# Patient Record
Sex: Male | Born: 1940 | Race: Black or African American | Hispanic: No | Smoking: Current every day smoker
Health system: Southern US, Community
[De-identification: ages and names within clinical notes are randomized; demographics above are authoritative.]

## PROBLEM LIST (undated history)

## (undated) DIAGNOSIS — G629 Polyneuropathy, unspecified: Secondary | ICD-10-CM

## (undated) DIAGNOSIS — I2699 Other pulmonary embolism without acute cor pulmonale: Secondary | ICD-10-CM

## (undated) DIAGNOSIS — E119 Type 2 diabetes mellitus without complications: Secondary | ICD-10-CM

## (undated) DIAGNOSIS — H9313 Tinnitus, bilateral: Secondary | ICD-10-CM

## (undated) DIAGNOSIS — I639 Cerebral infarction, unspecified: Secondary | ICD-10-CM

## (undated) DIAGNOSIS — I10 Essential (primary) hypertension: Secondary | ICD-10-CM

## (undated) DIAGNOSIS — N200 Calculus of kidney: Secondary | ICD-10-CM

## (undated) DIAGNOSIS — E785 Hyperlipidemia, unspecified: Secondary | ICD-10-CM

## (undated) DIAGNOSIS — R6882 Decreased libido: Secondary | ICD-10-CM

## (undated) DIAGNOSIS — N186 End stage renal disease: Secondary | ICD-10-CM

## (undated) DIAGNOSIS — N179 Acute kidney failure, unspecified: Secondary | ICD-10-CM

## (undated) DIAGNOSIS — H269 Unspecified cataract: Secondary | ICD-10-CM

## (undated) DIAGNOSIS — M1990 Unspecified osteoarthritis, unspecified site: Secondary | ICD-10-CM

## (undated) DIAGNOSIS — H9190 Unspecified hearing loss, unspecified ear: Secondary | ICD-10-CM

## (undated) DIAGNOSIS — C801 Malignant (primary) neoplasm, unspecified: Secondary | ICD-10-CM

## (undated) HISTORY — PX: LUMBAR LAMINECTOMY: SHX95

## (undated) HISTORY — DX: Decreased libido: R68.82

## (undated) HISTORY — DX: Unspecified cataract: H26.9

## (undated) HISTORY — DX: Other pulmonary embolism without acute cor pulmonale: I26.99

## (undated) HISTORY — DX: Tinnitus, bilateral: H93.13

## (undated) HISTORY — DX: Calculus of kidney: N20.0

## (undated) HISTORY — DX: End stage renal disease: N18.6

## (undated) HISTORY — DX: Unspecified hearing loss, unspecified ear: H91.90

## (undated) HISTORY — PX: CHOLECYSTECTOMY: SHX55

## (undated) HISTORY — DX: Acute kidney failure, unspecified: N17.9

---

## 1987-08-24 DIAGNOSIS — I639 Cerebral infarction, unspecified: Secondary | ICD-10-CM

## 1987-08-24 HISTORY — DX: Cerebral infarction, unspecified: I63.9

## 2000-08-02 ENCOUNTER — Encounter: Payer: Self-pay | Admitting: Neurosurgery

## 2000-08-02 ENCOUNTER — Encounter: Admission: RE | Admit: 2000-08-02 | Discharge: 2000-08-02 | Payer: Self-pay | Admitting: Neurosurgery

## 2009-01-23 ENCOUNTER — Encounter: Admission: RE | Admit: 2009-01-23 | Discharge: 2009-01-23 | Payer: Self-pay | Admitting: Orthopedic Surgery

## 2009-03-26 ENCOUNTER — Inpatient Hospital Stay (HOSPITAL_COMMUNITY): Admission: RE | Admit: 2009-03-26 | Discharge: 2009-03-28 | Payer: Self-pay | Admitting: Orthopedic Surgery

## 2010-09-14 ENCOUNTER — Encounter: Payer: Self-pay | Admitting: Orthopedic Surgery

## 2010-11-28 LAB — DIFFERENTIAL
Basophils Absolute: 0 10*3/uL (ref 0.0–0.1)
Basophils Relative: 0 % (ref 0–1)
Eosinophils Absolute: 0.2 10*3/uL (ref 0.0–0.7)
Eosinophils Relative: 2 % (ref 0–5)
Monocytes Absolute: 0.9 10*3/uL (ref 0.1–1.0)

## 2010-11-28 LAB — ABO/RH: ABO/RH(D): A POS

## 2010-11-28 LAB — GLUCOSE, CAPILLARY
Glucose-Capillary: 128 mg/dL — ABNORMAL HIGH (ref 70–99)
Glucose-Capillary: 137 mg/dL — ABNORMAL HIGH (ref 70–99)
Glucose-Capillary: 156 mg/dL — ABNORMAL HIGH (ref 70–99)
Glucose-Capillary: 166 mg/dL — ABNORMAL HIGH (ref 70–99)
Glucose-Capillary: 195 mg/dL — ABNORMAL HIGH (ref 70–99)
Glucose-Capillary: 80 mg/dL (ref 70–99)

## 2010-11-28 LAB — URINALYSIS, ROUTINE W REFLEX MICROSCOPIC
Specific Gravity, Urine: 1.005 (ref 1.005–1.030)
Urobilinogen, UA: 1 mg/dL (ref 0.0–1.0)
pH: 6 (ref 5.0–8.0)

## 2010-11-28 LAB — HEMOGLOBIN AND HEMATOCRIT, BLOOD: Hemoglobin: 13.1 g/dL (ref 13.0–17.0)

## 2010-11-28 LAB — CBC
HCT: 45.9 % (ref 39.0–52.0)
Platelets: 127 10*3/uL — ABNORMAL LOW (ref 150–400)
WBC: 12.2 10*3/uL — ABNORMAL HIGH (ref 4.0–10.5)

## 2010-11-28 LAB — COMPREHENSIVE METABOLIC PANEL
ALT: 20 U/L (ref 0–53)
AST: 19 U/L (ref 0–37)
Albumin: 3.8 g/dL (ref 3.5–5.2)
Alkaline Phosphatase: 48 U/L (ref 39–117)
BUN: 10 mg/dL (ref 6–23)
Chloride: 102 mEq/L (ref 96–112)
GFR calc Af Amer: >60 mL/min (ref >60)
Potassium: 3.2 mEq/L — ABNORMAL LOW (ref 3.5–5.1)
Total Bilirubin: 0.9 mg/dL (ref 0.3–1.2)

## 2011-01-05 NOTE — H&P (Signed)
NAME:  [name] [name], [name] [name] NO.:  [id number]   MEDICAL RECORD NO.:  [id number]          PATIENT TYPE:  INP   LOCATION:                               FACILITY:  [facility]   PHYSICIAN:  Georges Lynch. Gioffre, M.D.DATE OF BIRTH:  [birth year]   DATE OF ADMISSION:  03/26/2009  DATE OF DISCHARGE:                              HISTORY & PHYSICAL   The patient to be admitted to [facility] March 26, 2009.   CHIEF COMPLAINT:  Pain in lower back that radiates down to the left leg.   HISTORY OF PRESENT ILLNESS:  Mr. [name] [name] is a 70 year old male who has  been followed by Dr. Tinnie Gens for worsening low back pain.  The patient  had a myelogram that revealed classic spinal stenosis at L3-L4, L4-L5.  The patient plans to have central decompressive lumbar laminectomy at L3-  L4 and L4-L5 without fusion.  The patient states that the pain does  radiate down his left leg, and that he does have some weakness in his  left leg.   ALLERGIES:  The patient is allergic to LISINOPRIL.  The patient denies  food, latex or metal allergies.   PRIMARY CARE PHYSICIAN:  Dr. Linna Darner.   CURRENT MEDICATIONS:  The patient does not know the dosages of the  following medications.  I reminded him that he will need to bring the  dosages to the hospital.  Medication list:   1. Avalide.  2. Lantus.  3. Humalog.  4. Plavix.  5. Lipitor.  6. Flomax.  7. Aspirin.   PAST MEDICAL HISTORY:  1. Stroke in 1989.  2. Cataracts.  3. Dentures.  4. Hypertension.  5. Hyperlipidemia.  6. Hemorrhoids.  7. Diabetes that is insulin-controlled.  8. Bladder cancer.  9. Arthritis.   PAST SURGICAL HISTORY:  1. Cholecystectomy.  2. Surgery for bladder cancer.   FAMILY HISTORY:  Father passed with lung cancer at age 14.  Mother is  diabetic and passed at 47 years of age.   SOCIAL HISTORY:  Patient is widowed.  He is retired.  Patient denies use  of alcohol or drugs.  Patient is a nonsmoker.  Patient lives alone.  Patient  states that he does have a caregiver for after surgery, and  plans to go home after surgery.  He does have a 2-level house, but he is  able to have a bed on the first level that will require him only to go  up 1 stair to get into the house.   REVIEW OF SYSTEMS:  GENERAL:  Negative for fevers, chills or weight  loss.  HEENT/NEUROLOGICAL:  Negative for headache.  Negative for double  vision.  Negative for hearing loss.  Negative for weakness.  Positive  for dentures.  RESPIRATORY:  Negative for shortness of breath.  Negative  for cough.  Negative for wheezing.  CARDIOVASCULAR:  Negative for chest  pain.  Negative for palpitations.  Patient's last EKG was March 05, 2009,  and patient states there were no changes on this EKG.  GI:  Negative for  nausea.  Negative vomiting.  Negative for  blood in the stool.  GENITOURINARY:  Negative for pain with urination.  Positive for  urinating at nighttime.  Negative for blood in the urine, and negative  for frequent UTI.  MUSCULOSKELETAL:  Positive for back pain, back  spasms, and morning stiffness.   PHYSICAL EXAMINATION:  VITAL SIGNS:  Pulse 76, respirations 18, blood  pressure 123/80.  GENERAL:  The patient is alert and oriented x3.  The patient is a 70-  year-old male who is a good historian.  HEENT:  Unremarkable.  NECK:  Supple with full range of motion.  Negative for lymphadenopathy.  HEART:  Regular rate and rhythm.  Distant heart sounds without murmur.  ABDOMEN:  Soft, nontender, positive bowel sounds.  No guarding with  palpation.  EXTREMITIES:  Positive for weakness in the left hip flexors.  Sensation  is intact bilaterally.  The patient does have a positive straight leg  raise in the sitting position with the left leg.  NEUROLOGICAL:  The patient is intact.  SKIN:  Unremarkable.  PERIPHERAL VASCULAR:  Carotid arteries are 2+ bilaterally without  carotid bruit.  Dorsalis pedis pulses aer 2+ bilaterally.   IMPRESSION:  Back pain with  weakness and pain that radiates down the  patient's left leg.   PLAN:  Central decompressive lumbar laminectomy at L3-L4, L4-L5 to be  performed by Dr. Tinnie Gens on March 26, 2009.  The patient has been seen  and cleared for surgery by his primary care physician, Dr. Linna Darner.      Rozell Searing, PAC    ______________________________  Georges Lynch Darrelyn Hillock, M.D.    LD/MEDQ  D:  03/25/2009  T:  03/25/2009  Job:  [id number]

## 2011-01-05 NOTE — Op Note (Signed)
NAME:  [name] [name], [name] [name] NO.:  [id number]   MEDICAL RECORD NO.:  [id number]          PATIENT TYPE:  INP   LOCATION:  0003                         FACILITY:  [facility]   PHYSICIAN:  Georges Lynch. Gioffre, M.D.DATE OF BIRTH:  [birth year]   DATE OF PROCEDURE:  DATE OF DISCHARGE:                               OPERATIVE REPORT   PREOP DIAGNOSES:  1. Severe spinal stenosis at L3-4.  2. Severe spinal stenosis at L4-5.   POSTOP DIAGNOSES:  1. Severe spinal stenosis at L3-4.  2. Severe spinal stenosis at L4-5.   OPERATION:  1. Complete decompressive lumbar laminectomy at L3-4.  2. Complete lumbar laminectomy, decompressive type, at L4-5 both for      spinal stenosis with accompanying foraminotomies bilaterally at      both levels.   PROCEDURE:  Under general anesthesia, routine orthopedic prep and  draping of the back was carried out.  He had 2 grams of IV Ancef.  At  this time two needles were placed in the back for localization purposes  and an x-ray was taken.  Following that we made an incision over the L2,  L3-4, L4-5 interspace regions and the incision was extended proximally  and distally. At this time we stripped the muscle from the lamina and  spinous processes bilaterally.  Following that two Kocher clamps were  placed on the spinous processes to further identify our space.  We then  went down and started our decompression. We removed the spinous  processes of L3 and L4, a portion of L2 as well.  At that time we took  another x-ray with instruments in place to verify our exact position.  We then went down and brought the microscope in and completed our  decompressive lumbar laminectomies at L3-4 and at L4-5.  He was  extremely tight at L3-4 with a marked overgrowth of ligamentum flavum as  well as bone.  We identified the dura and gently protected the dura with  the pledgets and we made sure that the dura was free from both lateral  margins and we went out laterally  and decompressed both recesses.  We  had a very nice decompression at this point. We then advanced distally  at L4-5.  We went distally until we had good open space utilizing the  hockey stick between the dura and the bone.  We did foraminotomies as  well.  We then went up proximally and continued proximally until we had  a wide open decompression. The dura now was totally freed both  proximally and distally and laterally as well.  We did foraminotomies.  We utilized a hockey stick to go out the foramina to make sure there  were no other obstructions.  Note, the ligamentum flavum was severely  thickened in the various areas.  Following that we thoroughly irrigated  out the area.  I injected 10 mL of FloSeal into the area.  I applied  some thrombin-soaked Gelfoam out in the lateral margins and kept it away  from the dura and also put some between the muscle and when we were  ready to close the wound.  We then closed the wound in layers with #1  Vicryl suture.  At this time I left a small deep distal and proximal  part of the wound open for drainage purposes to make sure that no  hematomas would compress the dura.  I then closed subcu with 0 Vicryl  and skin with metal staples.  A sterile Neosporin dressing was  applied.  Note, he had 2 grams of IV Ancef preop and he also had a Foley  catheter in place.  We did go through the routine time-out procedure  prior to the surgery.   ASSISTANT:  Dr. Fayrene Fearing [place].           ______________________________  Georges Lynch. Darrelyn Hillock, M.D.     RAG/MEDQ  D:  03/26/2009  T:  03/26/2009  Job:  [id number]

## 2011-07-13 HISTORY — PX: FEMORAL BYPASS: SHX50

## 2011-07-22 HISTORY — PX: AMPUTATION: SHX166

## 2012-07-26 ENCOUNTER — Encounter (HOSPITAL_COMMUNITY): Payer: Self-pay

## 2012-07-27 ENCOUNTER — Encounter (HOSPITAL_COMMUNITY): Payer: Self-pay | Admitting: Pharmacy Technician

## 2012-07-27 ENCOUNTER — Encounter (HOSPITAL_COMMUNITY)
Admission: RE | Admit: 2012-07-27 | Discharge: 2012-07-27 | Disposition: A | Discharge status: Home / Self Care | Payer: Medicare Other | Source: Ambulatory Visit | Attending: Urology | Admitting: Urology

## 2012-07-27 ENCOUNTER — Encounter (HOSPITAL_COMMUNITY): Payer: Self-pay

## 2012-07-27 HISTORY — DX: Cerebral infarction, unspecified: I63.9

## 2012-07-27 HISTORY — DX: Essential (primary) hypertension: I10

## 2012-07-27 HISTORY — DX: Unspecified osteoarthritis, unspecified site: M19.90

## 2012-07-27 HISTORY — DX: Malignant (primary) neoplasm, unspecified: C80.1

## 2012-07-27 HISTORY — DX: Hyperlipidemia, unspecified: E78.5

## 2012-07-27 HISTORY — DX: Type 2 diabetes mellitus without complications: E11.9

## 2012-07-27 HISTORY — DX: Polyneuropathy, unspecified: G62.9

## 2012-07-27 LAB — CBC
Hemoglobin: 14.5 g/dL (ref 13.0–17.0)
MCH: 30.7 pg (ref 26.0–34.0)
MCV: 90.3 fL (ref 78.0–100.0)
Platelets: 199 10*3/uL (ref 150–400)
RBC: 4.72 MIL/uL (ref 4.22–5.81)
WBC: 11 10*3/uL — ABNORMAL HIGH (ref 4.0–10.5)

## 2012-07-27 LAB — BASIC METABOLIC PANEL
CO2: 27 mEq/L (ref 19–32)
Calcium: 9.6 mg/dL (ref 8.4–10.5)
Chloride: 105 mEq/L (ref 96–112)
Creatinine, Ser: 1.08 mg/dL (ref 0.50–1.35)
Glucose, Bld: 68 mg/dL — ABNORMAL LOW (ref 70–99)
Sodium: 142 mEq/L (ref 135–145)

## 2012-07-27 NOTE — Patient Instructions (Addendum)
20 [name] [name]  07/27/2012   Your procedure is scheduled on:  08/03/12  Report to [facility] at 07:30 AM.  Call this number if you have problems the morning of surgery: [phone]   Remember:   Do not eat food:After Midnight.  May have clear liquids:until Midnight .  Clear liquids include soda, tea, black coffee, apple or grape juice, broth.  Take these medicines the morning of surgery with A SIP OF WATER: Amlodipine and Avalide.  No insulin morning of surgery.    Do not wear jewelry, make-up or nail polish.  Do not wear lotions, powders, or perfumes.   Do not shave 48 hours prior to surgery. Men may shave face and neck.  Do not bring valuables to the hospital.  Contacts, dentures or bridgework may not be worn into surgery.  Leave suitcase in the car. After surgery it may be brought to your room.  For patients admitted to the hospital, checkout time is 11:00 AM the day of discharge.   Patients discharged the day of surgery will not be allowed to drive home.    Special Instructions: Shower using CHG 2 nights before surgery and the night before surgery.  If you shower the day of surgery use CHG.  Use special wash - you have one bottle of CHG for all showers.  You should use approximately 1/3 of the bottle for each shower.   Please read over the following fact sheets that you were given: Pain Booklet, Surgical Site Infection Prevention, Anesthesia Post-op Instructions and Care and Recovery After Surgery    Cystoscopy (Bladder Exam) A cystoscopy is an examination of your urinary bladder with a cystoscope. A cystoscope is an instrument like a small telescope with strong lights and lenses. It is inserted into the bladder through the urethra (the opening into the bladder) and allows your caregiver to examine the inside of your bladder. The procedure causes little discomfort and can be done in a hospital or office. It is a diagnostic procedure to evaluate the inside of your bladder. It may  involve x-rays to further evaluate the ureters or internal aspects of the kidneys. It may aid in the removal of urinary stones  or in taking tissue samples (biopsies) if necessary. The procedure is easier in females because of a shorter urethra. In a male, the procedure must be done through the penis. This often requires more sedation and more time to do the procedure. The procedure usually takes twenty minutes to one half hour for a male and approximately an hour for a male. LET YOUR CAREGIVERS KNOW ABOUT:  Allergies.  Medications taken including herbs, eye drops, over the counter medications, and creams.  Use of steroids (by mouth or creams).  Previous problems with anesthetics or novocaine.  Possibility of pregnancy, if this applies.  History of blood clots (thrombophlebitis).  History of bleeding or blood problems.  Previous surgery, especially where prosthetics have been used like hip or knee replacements, and heart valve replacements.  Other health problems. BEFORE THE PROCEDURE  You should be present 60 minutes prior to your procedure or as directed.  PROCEDURE During the procedure, you will:  Be assisted by your urologist and a nurse.  Lie on a cystoscopy table with your knees elevated and legs apart and covered with a drape. For women this is the same position as when a pap smear is taken.  Have the urethral area or penis washed and covered with sterile towels.  Have an anesthetic (numbing)  jelly applied to the urethra. This is usually all that is required for females but males may also require sedation.  Have the cystoscope inserted through the urethra and into the bladder. Sterile fluid will flow through the cystoscope and into the bladder. This will expand the bladder and provide clear fluid for the urologist to look through and examine the interior of the bladder.  Be allowed to go home once you are doing well, are stable, and awake if you were given a sedative. If  given a sedative, have someone give you a ride home. AFTER THE PROCEDURE  You may have temporary bleeding and burning on urination.  Drink lots of fluids. SEEK IMMEDIATE MEDICAL CARE IF:  There is an increase in blood in the urine or if you are passing clots.  You have difficulty in passing your urine  You develop chills and/or an unexplained oral temperature above 102 F (38.9 C). Your caregiver will discuss your results with you following the procedure. This may be at a later time if you have been sedated. If other testing or biopsies were taken, ask your caregiver how you are to obtain the results. Remember it is your responsibility to get your results. Do not assume everything is normal if you do not hear from your caregiver. Document Released: 08/06/2000 Document Revised: 11/01/2011 Document Reviewed: 01/31/2012 [facility] Patient Information 2013 [place], [place].    PATIENT INSTRUCTIONS POST-ANESTHESIA  IMMEDIATELY FOLLOWING SURGERY:  Do not drive or operate machinery for the first twenty four hours after surgery.  Do not make any important decisions for twenty four hours after surgery or while taking narcotic pain medications or sedatives.  If you develop intractable nausea and vomiting or a severe headache please notify your doctor immediately.  FOLLOW-UP:  Please make an appointment with your surgeon as instructed. You do not need to follow up with anesthesia unless specifically instructed to do so.  WOUND CARE INSTRUCTIONS (if applicable):  Keep a dry clean dressing on the anesthesia/puncture wound site if there is drainage.  Once the wound has quit draining you may leave it open to air.  Generally you should leave the bandage intact for twenty four hours unless there is drainage.  If the epidural site drains for more than 36-48 hours please call the anesthesia department.  QUESTIONS?:  Please feel free to call your physician or the hospital operator if you have any questions,  and they will be happy to assist you.

## 2012-08-03 ENCOUNTER — Ambulatory Visit (HOSPITAL_COMMUNITY): Payer: Medicare Other | Admitting: Anesthesiology

## 2012-08-03 ENCOUNTER — Ambulatory Visit (HOSPITAL_COMMUNITY)
Admission: RE | Admit: 2012-08-03 | Discharge: 2012-08-03 | Disposition: A | Discharge status: Home / Self Care | Payer: Medicare Other | Source: Ambulatory Visit | Attending: Urology | Admitting: Urology

## 2012-08-03 ENCOUNTER — Encounter (HOSPITAL_COMMUNITY)
Admission: RE | Disposition: A | Discharge status: Home / Self Care | Payer: Self-pay | Source: Ambulatory Visit | Attending: Urology

## 2012-08-03 ENCOUNTER — Ambulatory Visit (HOSPITAL_COMMUNITY): Payer: Medicare Other

## 2012-08-03 ENCOUNTER — Encounter (HOSPITAL_COMMUNITY): Payer: Self-pay | Admitting: Anesthesiology

## 2012-08-03 ENCOUNTER — Encounter (HOSPITAL_COMMUNITY): Payer: Self-pay | Admitting: *Deleted

## 2012-08-03 DIAGNOSIS — Z01810 Encounter for preprocedural cardiovascular examination: Secondary | ICD-10-CM | POA: Insufficient documentation

## 2012-08-03 DIAGNOSIS — D494 Neoplasm of unspecified behavior of bladder: Secondary | ICD-10-CM

## 2012-08-03 DIAGNOSIS — I10 Essential (primary) hypertension: Secondary | ICD-10-CM | POA: Insufficient documentation

## 2012-08-03 DIAGNOSIS — R82998 Other abnormal findings in urine: Secondary | ICD-10-CM | POA: Insufficient documentation

## 2012-08-03 DIAGNOSIS — Z01812 Encounter for preprocedural laboratory examination: Secondary | ICD-10-CM | POA: Insufficient documentation

## 2012-08-03 DIAGNOSIS — Z8551 Personal history of malignant neoplasm of bladder: Secondary | ICD-10-CM | POA: Insufficient documentation

## 2012-08-03 DIAGNOSIS — Z86018 Personal history of other benign neoplasm: Secondary | ICD-10-CM

## 2012-08-03 DIAGNOSIS — E119 Type 2 diabetes mellitus without complications: Secondary | ICD-10-CM | POA: Insufficient documentation

## 2012-08-03 HISTORY — PX: URETEROSCOPY: SHX842

## 2012-08-03 HISTORY — PX: BIOPSY: SHX5522

## 2012-08-03 HISTORY — PX: CYSTOSCOPY W/ RETROGRADES: SHX1426

## 2012-08-03 LAB — GLUCOSE, CAPILLARY: Glucose-Capillary: 100 mg/dL — ABNORMAL HIGH (ref 70–99)

## 2012-08-03 SURGERY — URETEROSCOPY
Anesthesia: General | Site: Urethra | Laterality: Right | Wound class: Clean Contaminated

## 2012-08-03 MED ORDER — PROMETHAZINE HCL 25 MG/ML IJ SOLN
INTRAMUSCULAR | Status: AC
  Filled 2012-08-03: qty 1

## 2012-08-03 MED ORDER — DEXAMETHASONE SODIUM PHOSPHATE 4 MG/ML IJ SOLN
4.0000 mg | Freq: Once | INTRAMUSCULAR | Status: AC
  Administered 2012-08-03: 4 mg via INTRAVENOUS

## 2012-08-03 MED ORDER — IOHEXOL 350 MG/ML SOLN
INTRAVENOUS | Status: DC | PRN
  Administered 2012-08-03 (×2): 50 mL via INTRAVENOUS

## 2012-08-03 MED ORDER — ONDANSETRON HCL 4 MG/2ML IJ SOLN
INTRAMUSCULAR | Status: AC
  Filled 2012-08-03: qty 2

## 2012-08-03 MED ORDER — STERILE WATER FOR IRRIGATION IR SOLN
Status: DC | PRN
  Administered 2012-08-03: 1000 mL

## 2012-08-03 MED ORDER — MIDAZOLAM HCL 2 MG/2ML IJ SOLN
1.0000 mg | INTRAMUSCULAR | Status: DC | PRN
  Administered 2012-08-03 (×2): 2 mg via INTRAVENOUS

## 2012-08-03 MED ORDER — FENTANYL CITRATE 0.05 MG/ML IJ SOLN
INTRAMUSCULAR | Status: AC
  Filled 2012-08-03: qty 2

## 2012-08-03 MED ORDER — ONDANSETRON HCL 4 MG/2ML IJ SOLN
INTRAMUSCULAR | Status: DC | PRN
  Administered 2012-08-03: 4 mg via INTRAVENOUS

## 2012-08-03 MED ORDER — FENTANYL CITRATE 0.05 MG/ML IJ SOLN
25.0000 ug | INTRAMUSCULAR | Status: DC | PRN
  Administered 2012-08-03: 25 ug via INTRAVENOUS

## 2012-08-03 MED ORDER — DROPERIDOL 2.5 MG/ML IJ SOLN
INTRAMUSCULAR | Status: DC | PRN
  Administered 2012-08-03: 0.625 mg via INTRAVENOUS

## 2012-08-03 MED ORDER — PROPOFOL 10 MG/ML IV BOLUS
INTRAVENOUS | Status: DC | PRN
  Administered 2012-08-03: 10 mg via INTRAVENOUS
  Administered 2012-08-03 (×2): 20 mg via INTRAVENOUS
  Administered 2012-08-03: 150 mg via INTRAVENOUS

## 2012-08-03 MED ORDER — EPHEDRINE SULFATE 50 MG/ML IJ SOLN
INTRAMUSCULAR | Status: DC | PRN
  Administered 2012-08-03 (×4): 5 mg via INTRAVENOUS

## 2012-08-03 MED ORDER — FENTANYL CITRATE 0.05 MG/ML IJ SOLN
INTRAMUSCULAR | Status: DC | PRN
  Administered 2012-08-03 (×5): 25 ug via INTRAVENOUS
  Administered 2012-08-03: 12.5 ug via INTRAVENOUS
  Administered 2012-08-03 (×2): 25 ug via INTRAVENOUS
  Administered 2012-08-03: 12.5 ug via INTRAVENOUS

## 2012-08-03 MED ORDER — PROMETHAZINE HCL 25 MG/ML IJ SOLN
6.2500 mg | Freq: Once | INTRAMUSCULAR | Status: AC
  Administered 2012-08-03: 6.25 mg via INTRAVENOUS

## 2012-08-03 MED ORDER — ONDANSETRON HCL 4 MG/2ML IJ SOLN: 4.0000 mg | Freq: Once | INTRAMUSCULAR | Status: DC | PRN

## 2012-08-03 MED ORDER — MIDAZOLAM HCL 2 MG/2ML IJ SOLN
INTRAMUSCULAR | Status: AC
  Filled 2012-08-03: qty 2

## 2012-08-03 MED ORDER — PROPOFOL 10 MG/ML IV EMUL
INTRAVENOUS | Status: AC
  Filled 2012-08-03: qty 20

## 2012-08-03 MED ORDER — LACTATED RINGERS IV SOLN
INTRAVENOUS | Status: DC
  Administered 2012-08-03 (×2): via INTRAVENOUS

## 2012-08-03 MED ORDER — SODIUM CHLORIDE 0.9 % IR SOLN
Status: DC | PRN
  Administered 2012-08-03 (×6): 3000 mL

## 2012-08-03 MED ORDER — OXYCODONE-ACETAMINOPHEN 7.5-325 MG PO TABS: 1.0000 | ORAL_TABLET | Freq: Four times a day (QID) | ORAL | Status: DC | PRN

## 2012-08-03 MED ORDER — LIDOCAINE HCL (PF) 1 % IJ SOLN
INTRAMUSCULAR | Status: AC
  Filled 2012-08-03: qty 5

## 2012-08-03 MED ORDER — DEXAMETHASONE SODIUM PHOSPHATE 4 MG/ML IJ SOLN
INTRAMUSCULAR | Status: AC
  Filled 2012-08-03: qty 1

## 2012-08-03 MED ORDER — 0.9 % SODIUM CHLORIDE (POUR BTL) OPTIME
TOPICAL | Status: DC | PRN
  Administered 2012-08-03: 1000 mL

## 2012-08-03 MED ORDER — EPHEDRINE SULFATE 50 MG/ML IJ SOLN
INTRAMUSCULAR | Status: AC
  Filled 2012-08-03: qty 1

## 2012-08-03 MED ORDER — LIDOCAINE HCL (CARDIAC) 10 MG/ML IV SOLN
INTRAVENOUS | Status: DC | PRN
  Administered 2012-08-03: 20 mg via INTRAVENOUS

## 2012-08-03 MED ORDER — ONDANSETRON HCL 4 MG/2ML IJ SOLN
4.0000 mg | Freq: Once | INTRAMUSCULAR | Status: AC
  Administered 2012-08-03: 4 mg via INTRAVENOUS

## 2012-08-03 SURGICAL SUPPLY — 32 items
BAG DRAIN URO TABLE W/ADPT NS (DRAPE) ×5 IMPLANT
CATH 5 FR WEDGE TIP (UROLOGICAL SUPPLIES) ×5 IMPLANT
CATH OPEN TIP 5FR (CATHETERS) ×10 IMPLANT
CLOTH BEACON ORANGE TIMEOUT ST (SAFETY) ×5 IMPLANT
CONT SPECI 4OZ STER CLIK (MISCELLANEOUS) ×10 IMPLANT
DECANTER SPIKE VIAL GLASS SM (MISCELLANEOUS) ×10 IMPLANT
DILATOR BALLN URETERAL SET (BALLOONS) IMPLANT
DILATOR UROMAX ULTRA (MISCELLANEOUS) ×5 IMPLANT
FORCEPS BIOP PIRANHA Y (CUTTING FORCEPS) ×5 IMPLANT
GLOVE BIO SURGEON STRL SZ7 (GLOVE) ×5 IMPLANT
GLOVE BIOGEL PI IND STRL 7.0 (GLOVE) ×8 IMPLANT
GLOVE BIOGEL PI INDICATOR 7.0 (GLOVE) ×2
GLOVE ECLIPSE 6.5 STRL STRAW (GLOVE) ×5 IMPLANT
GLOVE ECLIPSE 7.0 STRL STRAW (GLOVE) ×5 IMPLANT
GOWN STRL REIN XL XLG (GOWN DISPOSABLE) ×5 IMPLANT
GUIDEWIRE ANG ZIPWIRE 038X150 (WIRE) ×5 IMPLANT
GUIDEWIRE STR DUAL SENSOR (WIRE) ×5 IMPLANT
IV NS IRRIG 3000ML ARTHROMATIC (IV SOLUTION) ×30 IMPLANT
KIT ROOM TURNOVER AP CYSTO (KITS) ×5 IMPLANT
MANIFOLD NEPTUNE II (INSTRUMENTS) ×5 IMPLANT
NEEDLE HYPO 25X1 1.5 SAFETY (NEEDLE) ×5 IMPLANT
NS IRRIG 1000ML POUR BTL (IV SOLUTION) ×5 IMPLANT
PACK CYSTO (CUSTOM PROCEDURE TRAY) ×5 IMPLANT
PAD ARMBOARD 7.5X6 YLW CONV (MISCELLANEOUS) ×5 IMPLANT
PAD TELFA 3X4 1S STER (GAUZE/BANDAGES/DRESSINGS) ×5 IMPLANT
STENT PERCUFLEX 4.8FRX24 (STENTS) ×5 IMPLANT
STONE RETRIEVAL GEMINI 2.4 FR (MISCELLANEOUS) ×5 IMPLANT
SYR CONTROL 10ML LL (SYRINGE) ×5 IMPLANT
TOWEL OR 17X26 4PK STRL BLUE (TOWEL DISPOSABLE) ×5 IMPLANT
WATER STERILE IRR 1000ML POUR (IV SOLUTION) ×5 IMPLANT
WIRE GUIDE BENTSON .035 15CM (WIRE) ×10 IMPLANT
ZIPWIRE GUIDEWIRE STANDARD ×5 IMPLANT

## 2012-08-03 NOTE — Brief Op Note (Signed)
08/03/2012  11:54 AM  PATIENT:  [name]  71 y.o. male  PRE-OPERATIVE DIAGNOSIS:  bladder cancer  POST-OPERATIVE DIAGNOSIS:  * No post-op diagnosis entered *  PROCEDURE:  Procedure(s) (LRB) with comments: URETEROSCOPY (Right) CYSTOSCOPY WITH RETROGRADE PYELOGRAM (Bilateral) URETERAL CATHETER OR STENT PLACEMENT (Right) URETERAL DILITATION (N/A)  SURGEON:  Surgeon(s) and Role:    * Ky Barban, MD - Primary  PHYSICIAN ASSISTANT:   ASSISTANTS: none   ANESTHESIA:   general  EBL:  Total I/O In: 1750 [I.V.:1750] Out: 0   BLOOD ADMINISTERED:none  DRAINS:    LOCAL MEDICATIONS USED:  NONE  SPECIMEN:  Source of Specimen:  distal r ureter  DISPOSITION OF SPECIMEN:  PATHOLOGY  COUNTS:  YES  TOURNIQUET:  * No tourniquets in log *  DICTATION: .Other Dictation: Dictation Number dictation [id number]  PLAN OF CARE: Discharge to home after PACU  PATIENT DISPOSITION:  PACU - hemodynamically stable.   Delay start of Pharmacological VTE agent (>24hrs) due to surgical blood loss or risk of bleeding:

## 2012-08-03 NOTE — Anesthesia Preprocedure Evaluation (Addendum)
Anesthesia Evaluation  Patient identified by MRN, date of birth, ID band Patient awake    Reviewed: Allergy & Precautions, H&P , NPO status , Patient's Chart, lab work & pertinent test results  Airway Mallampati: II TM Distance: >3 FB     Dental  (+) Poor Dentition, Missing and Chipped   Pulmonary neg pulmonary ROS,  breath sounds clear to auscultation        Cardiovascular hypertension, Pt. on medications Rhythm:Regular Rate:Normal     Neuro/Psych CVA, No Residual Symptoms    GI/Hepatic negative GI ROS,   Endo/Other  diabetes, Well Controlled, Type 2, Insulin Dependent  Renal/GU      Musculoskeletal   Abdominal   Peds  Hematology   Anesthesia Other Findings   Reproductive/Obstetrics                           Anesthesia Physical Anesthesia Plan  ASA: III  Anesthesia Plan: General   Post-op Pain Management:    Induction: Intravenous  Airway Management Planned: LMA  Additional Equipment:   Intra-op Plan:   Post-operative Plan: Extubation in OR  Informed Consent: I have reviewed the patients History and Physical, chart, labs and discussed the procedure including the risks, benefits and alternatives for the proposed anesthesia with the patient or authorized representative who has indicated his/her understanding and acceptance.     Plan Discussed with:   Anesthesia Plan Comments:         Anesthesia Quick Evaluation

## 2012-08-03 NOTE — Anesthesia Procedure Notes (Signed)
Procedure Name: LMA Insertion Date/Time: 08/03/2012 9:15 AM Performed by: Franco Nones Pre-anesthesia Checklist: Patient identified, Patient being monitored, Emergency Drugs available, Timeout performed and Suction available Patient Re-evaluated:Patient Re-evaluated prior to inductionOxygen Delivery Method: Circle System Utilized Preoxygenation: Pre-oxygenation with 100% oxygen Intubation Type: IV induction Ventilation: Mask ventilation without difficulty LMA: LMA inserted LMA Size: 4.0 Number of attempts: 1 Placement Confirmation: positive ETCO2 and breath sounds checked- equal and bilateral Tube secured with: Tape

## 2012-08-03 NOTE — Progress Notes (Signed)
No change in H&P on reexamination. 

## 2012-08-03 NOTE — Transfer of Care (Addendum)
Immediate Anesthesia Transfer of Care Note  Patient: [name] [name]  Procedure(s) Performed: Procedure(s) (LRB): URETEROSCOPY (Right) CYSTOSCOPY WITH RETROGRADE PYELOGRAM (Bilateral) URETERAL CATHETER OR STENT PLACEMENT (Right) URETERAL DILITATION (N/A) BIOPSY (Right)  Patient Location: PACU  Anesthesia Type: General  Level of Consciousness: awake  Airway & Oxygen Therapy: Patient Spontanous Breathing and non-rebreather face mask  Post-op Assessment: Report given to PACU RN, Post -op Vital signs reviewed and stable and Patient moving all extremities  Post vital signs: Reviewed and stable  Complications:  N/V present post extubation

## 2012-08-03 NOTE — Anesthesia Postprocedure Evaluation (Signed)
  Anesthesia Post-op Note  Patient: [name] [name]  Procedure(s) Performed: Procedure(s) (LRB) with comments: URETEROSCOPY (Right) CYSTOSCOPY WITH RETROGRADE PYELOGRAM (Bilateral) URETERAL CATHETER OR STENT PLACEMENT (Right) URETERAL DILITATION (N/A) - balloon dilation BIOPSY (Right) - distal third of right ureter  Patient Location: Short Stay  Anesthesia Type:General  Level of Consciousness: awake, alert , oriented and patient cooperative  Airway and Oxygen Therapy: Patient Spontanous Breathing  Post-op Pain: mild  Post-op Assessment: Post-op Vital signs reviewed, Patient's Cardiovascular Status Stable, Respiratory Function Stable and No signs of Nausea or vomiting  Post-op Vital Signs: Reviewed and stable  Complications: No apparent anesthesia complications

## 2012-08-04 ENCOUNTER — Encounter (HOSPITAL_COMMUNITY): Payer: Self-pay | Admitting: *Deleted

## 2012-08-04 ENCOUNTER — Emergency Department (HOSPITAL_COMMUNITY)
Admission: EM | Admit: 2012-08-04 | Discharge: 2012-08-04 | Disposition: A | Discharge status: Home / Self Care | Payer: Medicare Other | Attending: Emergency Medicine | Admitting: Emergency Medicine

## 2012-08-04 DIAGNOSIS — E785 Hyperlipidemia, unspecified: Secondary | ICD-10-CM | POA: Insufficient documentation

## 2012-08-04 DIAGNOSIS — Z7901 Long term (current) use of anticoagulants: Secondary | ICD-10-CM | POA: Insufficient documentation

## 2012-08-04 DIAGNOSIS — R339 Retention of urine, unspecified: Secondary | ICD-10-CM | POA: Insufficient documentation

## 2012-08-04 DIAGNOSIS — Z79899 Other long term (current) drug therapy: Secondary | ICD-10-CM | POA: Insufficient documentation

## 2012-08-04 DIAGNOSIS — Z8673 Personal history of transient ischemic attack (TIA), and cerebral infarction without residual deficits: Secondary | ICD-10-CM | POA: Insufficient documentation

## 2012-08-04 DIAGNOSIS — Z8551 Personal history of malignant neoplasm of bladder: Secondary | ICD-10-CM | POA: Insufficient documentation

## 2012-08-04 DIAGNOSIS — Z7982 Long term (current) use of aspirin: Secondary | ICD-10-CM | POA: Insufficient documentation

## 2012-08-04 DIAGNOSIS — R300 Dysuria: Secondary | ICD-10-CM | POA: Insufficient documentation

## 2012-08-04 DIAGNOSIS — Z794 Long term (current) use of insulin: Secondary | ICD-10-CM | POA: Insufficient documentation

## 2012-08-04 DIAGNOSIS — R319 Hematuria, unspecified: Secondary | ICD-10-CM | POA: Insufficient documentation

## 2012-08-04 DIAGNOSIS — F17200 Nicotine dependence, unspecified, uncomplicated: Secondary | ICD-10-CM | POA: Insufficient documentation

## 2012-08-04 DIAGNOSIS — I10 Essential (primary) hypertension: Secondary | ICD-10-CM | POA: Insufficient documentation

## 2012-08-04 DIAGNOSIS — Z8739 Personal history of other diseases of the musculoskeletal system and connective tissue: Secondary | ICD-10-CM | POA: Insufficient documentation

## 2012-08-04 DIAGNOSIS — E119 Type 2 diabetes mellitus without complications: Secondary | ICD-10-CM | POA: Insufficient documentation

## 2012-08-04 LAB — URINE MICROSCOPIC-ADD ON

## 2012-08-04 LAB — URINALYSIS, ROUTINE W REFLEX MICROSCOPIC
Glucose, UA: 500 mg/dL — AB
pH: 6 (ref 5.0–8.0)

## 2012-08-04 LAB — BASIC METABOLIC PANEL
BUN: 19 mg/dL (ref 6–23)
CO2: 26 mEq/L (ref 19–32)
Calcium: 9.6 mg/dL (ref 8.4–10.5)
Glucose, Bld: 266 mg/dL — ABNORMAL HIGH (ref 70–99)
Sodium: 141 mEq/L (ref 135–145)

## 2012-08-04 LAB — CBC WITH DIFFERENTIAL/PLATELET
Eosinophils Relative: 0 % (ref 0–5)
HCT: 44 % (ref 39.0–52.0)
Hemoglobin: 15.3 g/dL (ref 13.0–17.0)
Lymphocytes Relative: 6 % — ABNORMAL LOW (ref 12–46)
Lymphs Abs: 1.2 10*3/uL (ref 0.7–4.0)
MCH: 31.7 pg (ref 26.0–34.0)
MCV: 91.3 fL (ref 78.0–100.0)
Monocytes Absolute: 1.2 10*3/uL — ABNORMAL HIGH (ref 0.1–1.0)
Monocytes Relative: 7 % (ref 3–12)
Platelets: 195 10*3/uL (ref 150–400)
RBC: 4.82 MIL/uL (ref 4.22–5.81)
WBC: 19 10*3/uL — ABNORMAL HIGH (ref 4.0–10.5)

## 2012-08-04 LAB — PROTIME-INR: INR: 1.09 (ref 0.00–1.49)

## 2012-08-04 MED ORDER — DEXTROSE 5 % IV SOLN
1.0000 g | Freq: Once | INTRAVENOUS | Status: AC
  Administered 2012-08-04: 1 g via INTRAVENOUS
  Filled 2012-08-04: qty 10

## 2012-08-04 MED ORDER — CEPHALEXIN 500 MG PO CAPS: 500.0000 mg | ORAL_CAPSULE | Freq: Four times a day (QID) | ORAL | Status: DC

## 2012-08-04 MED ORDER — ONDANSETRON 4 MG PO TBDP: 4.0000 mg | ORAL_TABLET | Freq: Three times a day (TID) | ORAL | Status: DC | PRN

## 2012-08-04 MED ORDER — ONDANSETRON HCL 4 MG/2ML IJ SOLN
4.0000 mg | Freq: Once | INTRAMUSCULAR | Status: AC
  Administered 2012-08-04: 4 mg via INTRAVENOUS
  Filled 2012-08-04: qty 2

## 2012-08-04 NOTE — ED Notes (Addendum)
Unable to urinate since 1pm. , Had surgery yesterday, Dr Jerre Simon.   ?stent placement, ureterostomy .  Vomitng since surgery.

## 2012-08-04 NOTE — ED Provider Notes (Addendum)
History   This chart was scribed for Shelda Jakes, MD by Gerlean Ren, ED Scribe. This patient was seen in room [facility]03/[facility]03 and the patient's care was started at 7:14 PM    CSN: [id number]  Arrival date & time 08/04/12  1721   First MD Initiated Contact with Patient 08/04/12 1817      Chief Complaint  Patient presents with  . Urinary Retention     The history is provided by the patient and a relative. No language interpreter was used.   [name] [name] is a 71 y.o. male with h/o HTN, DM, CVA, bladder cancer, and neuropathy who presents to the Emergency Department complaining of urinary retention.  Pt last urinated on his own at 1:00 PM with associated constant, gradually worsening, dull, non-radiating lower abdominal pain and constant nausea since then.  Pt denies emesis, chest pain, dyspnea, cough, HA, congestion, sore throat, fever, back pain.  Pt has had bladder cancer first diagnosed 5.5-6 years ago.  Pt had a stent put in place yesterday here that is to be removed 12/23.  Pt had catheter put in place here in ED with immediate light pink urine.  Pt is a current everyday smoker but denies alcohol use.  Urologist Dr. Renda Rolls PCP is Dr. Janeece Fitting in [place].  Past Medical History  Diagnosis Date  . Hypertension   . Diabetes mellitus without complication   . Hyperlipidemia   . Neuropathy   . Stroke 1989    no deficits  . Arthritis   . Cancer     Bladder    Past Surgical History  Procedure Date  . Cholecystectomy   . Bladder cancer   . Lumbar laminectomy   . 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 Femoral bypass     History reviewed. No pertinent family history.  History  Substance Use Topics  . Smoking status: Current Every Day Smoker -- 0.2 packs/day for 40 years    Types: Cigarettes  . Smokeless tobacco: Not on file  . Alcohol Use: No      Review of Systems  Constitutional: Negative for fever.  HENT: Negative for congestion and  sore throat.   Respiratory: Negative for cough and shortness of breath.   Cardiovascular: Negative for chest pain.  Gastrointestinal: Negative for nausea, vomiting, abdominal pain and diarrhea.  Genitourinary: Positive for difficulty urinating.  Musculoskeletal: Negative for back pain.  Skin: Negative for rash.  Neurological: Negative for headaches.  Hematological: Does not bruise/bleed easily.  Psychiatric/Behavioral: Negative for confusion.    Allergies  Ace inhibitors; Codeine; and Lisinopril  Home Medications   Current Outpatient Rx  Name  Route  Sig  Dispense  Refill  . AMLODIPINE BESYLATE 10 MG PO TABS   Oral   Take 10 mg by mouth daily.         . ASPIRIN EC 81 MG PO TBEC   Oral   Take 81 mg by mouth daily.         . ATORVASTATIN CALCIUM 20 MG PO TABS   Oral   Take 20 mg by mouth daily.         Marland Kitchen CLOPIDOGREL BISULFATE 75 MG PO TABS   Oral   Take 75 mg by mouth daily.         Marland Kitchen GLIPIZIDE 10 MG PO TABS   Oral   Take 20 mg by mouth 2 (two) times daily before a meal.         .  INSULIN GLARGINE 100 UNIT/ML [place] SOLN   Subcutaneous   Inject 46 Units into the skin daily.         . INSULIN LISPRO (HUMAN) 100 UNIT/ML [place] SOLN   Subcutaneous   Inject 40 Units into the skin daily as needed. Sliding scale         . IRBESARTAN-HYDROCHLOROTHIAZIDE 300-12.5 MG PO TABS   Oral   Take 1 tablet by mouth daily.         Marland Kitchen METFORMIN HCL 1000 MG PO TABS   Oral   Take 1,000 mg by mouth 2 (two) times daily with a meal.         . OXYCODONE-ACETAMINOPHEN 7.5-325 MG PO TABS   Oral   Take 1 tablet by mouth every 6 (six) hours as needed for pain.   30 tablet   0   . PREGABALIN 75 MG PO CAPS   Oral   Take 75 mg by mouth 2 (two) times daily.         Marland Kitchen TAMSULOSIN HCL 0.4 MG PO CAPS   Oral   Take 0.4 mg by mouth daily.         . WARFARIN SODIUM 5 MG PO TABS   Oral   Take 10 mg by mouth daily.         . CEPHALEXIN 500 MG PO CAPS   Oral   Take 1  capsule (500 mg total) by mouth 4 (four) times daily.   28 capsule   0   . ONDANSETRON 4 MG PO TBDP   Oral   Take 1 tablet (4 mg total) by mouth every 8 (eight) hours as needed for nausea.   12 tablet   0     BP 213/97  Pulse 94  Temp 97.6 F (36.4 C) (Oral)  Resp 22  Ht 6' 3.5" (1.918 m)  Wt 240 lb (108.863 kg)  BMI 29.60 kg/m2  SpO2 100%  Physical Exam  Nursing note and vitals reviewed. Constitutional: He is oriented to person, place, and time. He appears well-developed and well-nourished.  HENT:  Head: Normocephalic and atraumatic.  Eyes: Conjunctivae normal and EOM are normal.  Neck: Normal range of motion. No tracheal deviation present.  Cardiovascular: Normal rate, regular rhythm and normal heart sounds.   Pulmonary/Chest: Effort normal and breath sounds normal. He has no wheezes.  Abdominal: Soft. Bowel sounds are normal. There is no tenderness.  Genitourinary:       1200cc maroon-colored urine in catheter bag at bedside.  Musculoskeletal: Normal range of motion.       Right foot cap refill <2 seconds.  Blood flow slow in left leg.  Neurological: He is alert and oriented to person, place, and time. No cranial nerve deficit. Coordination normal.  Skin: Skin is warm. No rash noted.  Psychiatric: He has a normal mood and affect.    ED Course  Procedures (including critical care time) DIAGNOSTIC STUDIES: Oxygen Saturation is 100% on room air, normal by my interpretation.    COORDINATION OF CARE: 7:25 PM- Patient and son informed of clinical course, understand medical decision-making process, and agree with plan.  Ordered IV Rocephin, CBC, b-met, urinalysis, and urine culture.  Labs Reviewed  URINALYSIS, ROUTINE W REFLEX MICROSCOPIC - Abnormal; Notable for the following:    Color, Urine STRAW (*)     APPearance CLOUDY (*)     Glucose, UA 500 (*)     Hgb urine dipstick LARGE (*)     Ketones, ur  TRACE (*)     Protein, ur 100 (*)     Leukocytes, UA SMALL (*)      All other components within normal limits  URINE MICROSCOPIC-ADD ON - Abnormal; Notable for the following:    Bacteria, UA FEW (*)     All other components within normal limits  CBC WITH DIFFERENTIAL - Abnormal; Notable for the following:    WBC 19.0 (*)     Neutrophils Relative 87 (*)     Neutro Abs 16.6 (*)     Lymphocytes Relative 6 (*)     Monocytes Absolute 1.2 (*)     All other components within normal limits  BASIC METABOLIC PANEL - Abnormal; Notable for the following:    Glucose, Bld 266 (*)     Creatinine, Ser 1.45 (*)     GFR calc non Af Amer 47 (*)     GFR calc Af Amer 55 (*)     All other components within normal limits  PROTIME-INR  URINE CULTURE   Results for orders placed during the hospital encounter of 08/04/12  URINALYSIS, ROUTINE W REFLEX MICROSCOPIC      Component Value Range   Color, Urine STRAW (*) YELLOW   APPearance CLOUDY (*) CLEAR   Specific Gravity, Urine 1.010  1.005 - 1.030   pH 6.0  5.0 - 8.0   Glucose, UA 500 (*) NEGATIVE mg/dL   Hgb urine dipstick LARGE (*) NEGATIVE   Bilirubin Urine NEGATIVE  NEGATIVE   Ketones, ur TRACE (*) NEGATIVE mg/dL   Protein, ur 161 (*) NEGATIVE mg/dL   Urobilinogen, UA 0.2  0.0 - 1.0 mg/dL   Nitrite NEGATIVE  NEGATIVE   Leukocytes, UA SMALL (*) NEGATIVE  URINE MICROSCOPIC-ADD ON      Component Value Range   Squamous Epithelial / LPF RARE  RARE   WBC, UA 7-10  <3 WBC/hpf   RBC / HPF TOO NUMEROUS TO COUNT  <3 RBC/hpf   Bacteria, UA FEW (*) RARE  CBC WITH DIFFERENTIAL      Component Value Range   WBC 19.0 (*) 4.0 - 10.5 K/uL   RBC 4.82  4.22 - 5.81 MIL/uL   Hemoglobin 15.3  13.0 - 17.0 g/dL   HCT 09.6  04.5 - 40.9 %   MCV 91.3  78.0 - 100.0 fL   MCH 31.7  26.0 - 34.0 pg   MCHC 34.8  30.0 - 36.0 g/dL   RDW 81.1  91.4 - 78.2 %   Platelets 195  150 - 400 K/uL   Neutrophils Relative 87 (*) 43 - 77 %   Neutro Abs 16.6 (*) 1.7 - 7.7 K/uL   Lymphocytes Relative 6 (*) 12 - 46 %   Lymphs Abs 1.2  0.7 - 4.0 K/uL    Monocytes Relative 7  3 - 12 %   Monocytes Absolute 1.2 (*) 0.1 - 1.0 K/uL   Eosinophils Relative 0  0 - 5 %   Eosinophils Absolute 0.0  0.0 - 0.7 K/uL   Basophils Relative 0  0 - 1 %   Basophils Absolute 0.0  0.0 - 0.1 K/uL  BASIC METABOLIC PANEL      Component Value Range   Sodium 141  135 - 145 mEq/L   Potassium 3.6  3.5 - 5.1 mEq/L   Chloride 101  96 - 112 mEq/L   CO2 26  19 - 32 mEq/L   Glucose, Bld 266 (*) 70 - 99 mg/dL   BUN 19  6 -  23 mg/dL   Creatinine, Ser 1.61 (*) 0.50 - 1.35 mg/dL   Calcium 9.6  8.4 - 09.6 mg/dL   GFR calc non Af Amer 47 (*) >90 mL/min   GFR calc Af Amer 55 (*) >90 mL/min  PROTIME-INR      Component Value Range   Prothrombin Time 14.0  11.6 - 15.2 seconds   INR 1.09  0.00 - 1.49    Dg Retrograde Pyelogram  08/03/2012  *RADIOLOGY REPORT*  Clinical Data: Ureteral stenosis  RETROGRADE PYELOGRAM  Comparison: None.  Findings: 13 minutes and 16 seconds of fluoroscopy was utilized for this exam.  The initial image demonstrates a right ureteral stent in position.  The stent was then removed and injections performed in the distal right ureter.  This reveals some irregularity of the distal right ureter.  No true obstructive changes are noted. A wire was then placed and balloon dilatation of the distal irregular right ureter performed.  A double-J ureteral stent was then placed in satisfactory position on the right.  Injections on the left reveal no focal abnormality.   Original Report Authenticated By: Alcide Clever, M.D.      1. Urinary retention   2. Hematuria       MDM    Patient with definite urinary retention. Foley was placed at about 1200 cc out of the gross hematuria. Patient has a history of bladder cancer in the past currently being worked up for possible recurrence. Also could represent urinary tract infection however the patient did have a stent placed yesterday urinalysis in the face is 10 is very difficult to sort out however since patient in  the retention problems will go head and treat Rocephin 1 g IV piggyback in the emergency part and continue Keflex at home.  Noted the patient is on Coumadin will therefore check INR however patient's original medication list did not have it listed.  INR is pending. Patient without any sniffing reduction his hemoglobin and hematocrit. Slight increase in his BUN and creatinine compared to previous testing. Will await the INR to make sure the Coumadin is appropriately dosed and is not significant elevated one would think that was probably stop the procedure that was done yesterday so he may be on the low side that would be okay.  I personally performed the services described in this documentation, which was scribed in my presence. The recorded information has been reviewed and is accurate.     Addendum: Patient's INR is not significantly elevated.  INR is actually subtherapeutic however he has been off his Coumadin due to to the urologic procedure and stenting. Patient is cleared for discharge.    Shelda Jakes, MD 08/04/12 0454  Shelda Jakes, MD 08/04/12 2129

## 2012-08-04 NOTE — Op Note (Signed)
NAME:  [name] [name], [name] [name] NO.:  [id number]  MEDICAL RECORD NO.:  [id number]  LOCATION:  [facility]                          FACILITY:  [facility]  PHYSICIAN:  Ky Barban, M.D.DATE OF BIRTH:  02-Apr-1941  DATE OF PROCEDURE: DATE OF DISCHARGE:  08/03/2012                              OPERATIVE REPORT   PREOPERATIVE DIAGNOSES:  Positive urine cytologies, history of bladder cancer.  PROCEDURE:  Cystoscopy, bilateral retrograde pyelogram, collection of urine from both renal pelvises, right ureteral dilation, and right ureteroscopy with multiple biopsies of the distal ureter.  ANESTHESIA:  General endotracheal.  DESCRIPTION OF PROCEDURE:  The patient under general endotracheal anesthesia in lithotomy position, after usual prep and drape, #25 cystoscope introduced into the bladder.  It was inspected grossly.  The bladder looked normal.  I did not see any evidence and recurrence of tumor.  I did not see any carcinoma in situ.  Now the right ureteral orifice catheterized with a wedge catheter.  Hypaque is injected under fluoroscopic control.  The distal ureter was slightly dilated with irregular margins.  The upper ureter looked fine, smooth caliber, not dilated.  The renal pelvis was normal size, not dilated.  Calices were well cupped.  I did not see any filling defect.  Same procedure was done on the left side after collecting the urine from the right renal pelvis, sending it for cytologies.  Similarly the left ureter retrograde pyelogram was done.  The left ureter looked smooth and not dilated in its entire length.  I do not see any filling defect.  A open-end catheter was passed up into the left renal pelvis.  Urine was collected for cytologic examination and the catheter was removed.  Then I went to the right side again.  A guidewire was passed up into the renal pelvis with considerable difficulty.  There seemed to be obstruction at the level of the ureterovesical  junction, and I do not know if there is a stricture but it was dilated further with balloon dilator.  Once I got the guidewire into the renal pelvis, I put open-ended catheter over the guidewire all the way into the renal pelvis and the dye was injected to outline the collecting system to make sure my guidewire was in the right place.  After making sure the guidewire was in the right place, I introduced balloon dilator #15 over the guidewire and the intramural ureter along with the ureterovesical junction was dilated.  Then the balloon was removed leaving the guidewire in place and then over the guidewire I introduced short rigid ureteroscope, looked into the distal ureter.  It looked like there were some yellowish lesions in the ureter. I did not see any typical transitional cell carcinoma and then with the help of the biopsy forceps, several biopsies were taken from the distal ureter and after taking the biopsies, the ureteroscope was removed and leaving the guidewire in place, then cystoscope was introduced into the bladder over the guidewire.  Now double-J stents, 5-French 24 cm was introduced over the guidewire.  Under fluoroscopic control, it was adjusted between the renal pelvis and the bladder.  Nice loop was obtained in the renal pelvis and the  bladder. The guidewire has been removed.  After removing that I saw the loop in the renal pelvis and the bladder.  All the instruments were removed. The patient left the operating room in satisfactory condition.     Ky Barban, M.D.     MIJ/MEDQ  D:  08/03/2012  T:  08/04/2012  Job:  [id number]

## 2012-08-04 NOTE — ED Notes (Signed)
Inserted 68F foley catheter w/immediate return of light pink urine.

## 2012-08-06 LAB — URINE CULTURE

## 2012-08-08 ENCOUNTER — Encounter (HOSPITAL_COMMUNITY): Payer: Self-pay | Admitting: Urology

## 2013-03-30 HISTORY — PX: OTHER SURGICAL HISTORY: SHX169

## 2013-03-30 HISTORY — PX: CYSTOSTOMY W/ BLADDER BIOPSY: SHX1431

## 2013-03-30 HISTORY — PX: CYSTOSCOPY WITH RETROGRADE PYELOGRAM, URETEROSCOPY AND STENT PLACEMENT: SHX5789

## 2013-08-31 HISTORY — PX: CYSTOSCOPY W/ RETROGRADES: SHX1426

## 2013-11-29 HISTORY — PX: REMOVAL OF A DIALYSIS CATHETER: SHX6053

## 2013-11-29 HISTORY — PX: AV FISTULA PLACEMENT: SHX1204

## 2014-01-11 HISTORY — PX: URETEROSCOPY: SHX842

## 2014-01-11 HISTORY — PX: CYSTOSTOMY W/ BLADDER BIOPSY: SHX1431

## 2014-01-11 HISTORY — PX: TRANSURETHRAL RESECTION OF BLADDER TUMOR: SHX2575

## 2014-04-30 ENCOUNTER — Non-Acute Institutional Stay (SKILLED_NURSING_FACILITY): Payer: Medicare Other | Admitting: Internal Medicine

## 2014-04-30 DIAGNOSIS — N039 Chronic nephritic syndrome with unspecified morphologic changes: Secondary | ICD-10-CM

## 2014-04-30 DIAGNOSIS — I150 Renovascular hypertension: Secondary | ICD-10-CM

## 2014-04-30 DIAGNOSIS — E1129 Type 2 diabetes mellitus with other diabetic kidney complication: Secondary | ICD-10-CM

## 2014-04-30 DIAGNOSIS — N1330 Unspecified hydronephrosis: Secondary | ICD-10-CM

## 2014-04-30 DIAGNOSIS — D631 Anemia in chronic kidney disease: Secondary | ICD-10-CM

## 2014-05-02 DIAGNOSIS — N1330 Unspecified hydronephrosis: Secondary | ICD-10-CM | POA: Insufficient documentation

## 2014-05-02 DIAGNOSIS — N039 Chronic nephritic syndrome with unspecified morphologic changes: Secondary | ICD-10-CM

## 2014-05-02 DIAGNOSIS — I150 Renovascular hypertension: Secondary | ICD-10-CM | POA: Insufficient documentation

## 2014-05-02 DIAGNOSIS — D631 Anemia in chronic kidney disease: Secondary | ICD-10-CM | POA: Insufficient documentation

## 2014-05-02 DIAGNOSIS — E1129 Type 2 diabetes mellitus with other diabetic kidney complication: Secondary | ICD-10-CM | POA: Insufficient documentation

## 2014-05-02 NOTE — Progress Notes (Signed)
HISTORY & PHYSICAL  DATE: 04/30/2014   FACILITY: [facility]  LEVEL OF CARE: SNF (31)  ALLERGIES:  Allergies  Allergen Reactions  . Ace Inhibitors Swelling  . Codeine Nausea And Vomiting  . Lisinopril Other (See Comments)    REACTION: Unknown    CHIEF COMPLAINT:  Manage right hydroureteronephrosis, anemia of chronic kidney disease and diabetes mellitus  HISTORY OF PRESENT ILLNESS: 73 year old African American male was hospitalized secondary to nausea, vomiting, confusion and fluid overload. After hospitalization he is admitted to this facility for short-term rehabilitation.  RIGHT HYDROURETERONEPHROSIS: Patient was diagnosed with severe right hydroureteronephrosis at presentation to the hospital. He had a left-sided ureteral stent placed by urology and right-sided nephrostomy tube placed by interventional radiology. He tolerated the procedures well. He is admitted to this facility for short-term rehabilitation.  ANEMIA: The anemia has been stable. The patient denies fatigue, melena or hematochezia. No complications from the medications currently being used. Patient required multiple blood transfusions while in the hospital. His anemia is secondary to chronic kidney disease.  DM:pt's DM remains stable.  Pt denies polyuria, polydipsia, polyphagia, changes in vision or hypoglycemic episodes.  No complications noted from the medication presently being used.  Last hemoglobin A1c is: Not available.  PAST MEDICAL HISTORY :  Past Medical History  Diagnosis Date  . Hypertension   . Diabetes mellitus without complication   . Hyperlipidemia   . Neuropathy   . Stroke 1989    no deficits  . Arthritis   . Cancer     Bladder    PAST SURGICAL HISTORY: Past Surgical History  Procedure Laterality Date  . Cholecystectomy    . Bladder cancer    . Lumbar laminectomy    . 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 Femoral bypass    . Ureteroscopy   08/03/2012    Procedure: URETEROSCOPY;  Surgeon: Marissa Nestle, MD;  Location: [facility];  Service: Urology;  Laterality: Right;  . Cystoscopy w/ retrogrades  08/03/2012    Procedure: CYSTOSCOPY WITH RETROGRADE PYELOGRAM;  Surgeon: Marissa Nestle, MD;  Location: [facility];  Service: Urology;  Laterality: Bilateral;  . Esophageal biopsy  08/03/2012    Procedure: BIOPSY;  Surgeon: Marissa Nestle, MD;  Location: [facility];  Service: Urology;  Laterality: Right;  distal third of right ureter    SOCIAL HISTORY:  reports that he has been smoking Cigarettes.  He has a 10 pack-year smoking history. He does not have any smokeless tobacco history on file. He reports that he does not drink alcohol or use illicit drugs.  FAMILY HISTORY: Mother had diabetes mellitus, hypertension and hyperlipidemia, father had lung cancer  CURRENT MEDICATIONS: Reviewed per MAR/see medication list  REVIEW OF SYSTEMS:  See HPI otherwise 14 point ROS is negative.  PHYSICAL EXAMINATION  VS:  See VS section  GENERAL: no acute distress, normal body habitus EYES: conjunctivae normal, sclerae normal, normal eye lids MOUTH/THROAT: lips without lesions,no lesions in the mouth,tongue is without lesions,uvula elevates in midline NECK: supple, trachea midline, no neck masses, no thyroid tenderness, no thyromegaly LYMPHATICS: no LAN in the neck, no supraclavicular LAN RESPIRATORY: breathing is even & unlabored, BS CTAB CARDIAC: RRR, no murmur,no extra heart sounds, no edema GI:  ABDOMEN: abdomen soft, normal BS, no masses, no tenderness  LIVER/SPLEEN: no hepatomegaly, no splenomegaly MUSCULOSKELETAL: HEAD: normal to inspection  EXTREMITIES: LEFT UPPER EXTREMITY: full  range of motion, normal strength & tone RIGHT UPPER EXTREMITY:  full range of motion, normal strength & tone LEFT LOWER EXTREMITY:  full range of motion, normal strength & tone RIGHT LOWER EXTREMITY:  full range of motion, normal strength &  tone PSYCHIATRIC: the patient is alert & oriented to person, affect & behavior appropriate  LABS/RADIOLOGY:  04-24-14 PT 17.2, INR 1.78, fibrinogen  613, BUN 34, glucose 138, creatinine 4.79 otherwise BMP normal, magnesium 1.8, phosphorus 3.1, hemoglobin 8.1, MCV 93.5 otherwise CBC normal    ASSESSMENT/PLAN:  Right hydroureteronephrosis-status post nephrostomy tube placement. Followup with urologist. Anemia of chronic kidney disease-status post multiple transfusions. Continue iron. Recheck hemoglobin. 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  CPT CODE: 45038  Jennier Schissler Y Zeya Balles, [place] [phone]

## 2014-05-17 ENCOUNTER — Non-Acute Institutional Stay (SKILLED_NURSING_FACILITY): Payer: Medicare Other | Admitting: Adult Health

## 2014-05-17 ENCOUNTER — Encounter: Payer: Self-pay | Admitting: Adult Health

## 2014-05-17 DIAGNOSIS — E1129 Type 2 diabetes mellitus with other diabetic kidney complication: Secondary | ICD-10-CM

## 2014-05-17 DIAGNOSIS — D631 Anemia in chronic kidney disease: Secondary | ICD-10-CM

## 2014-05-17 DIAGNOSIS — I150 Renovascular hypertension: Secondary | ICD-10-CM

## 2014-05-17 DIAGNOSIS — N1330 Unspecified hydronephrosis: Secondary | ICD-10-CM

## 2014-05-17 DIAGNOSIS — E785 Hyperlipidemia, unspecified: Secondary | ICD-10-CM

## 2014-05-17 DIAGNOSIS — G629 Polyneuropathy, unspecified: Secondary | ICD-10-CM

## 2014-05-17 DIAGNOSIS — G589 Mononeuropathy, unspecified: Secondary | ICD-10-CM

## 2014-05-17 DIAGNOSIS — N039 Chronic nephritic syndrome with unspecified morphologic changes: Secondary | ICD-10-CM

## 2014-05-17 DIAGNOSIS — N400 Enlarged prostate without lower urinary tract symptoms: Secondary | ICD-10-CM

## 2014-05-17 NOTE — Progress Notes (Signed)
Patient ID: [name] [name], male   DOB: Jun 18, 1941, 73 y.o.   MRN: [id number]              PROGRESS NOTE  DATE: 05/17/2014   FACILITY: [facility]  LEVEL OF CARE: SNF (31)  Discharge Notes  HISTORY OF PRESENT ILLNESS:   This is a 73 year old male who is for discharge home with home health PT, OT and nursing. He has been at the bed to [facility] on 04/26/14 from [facility] with nausea, vomiting, confusion and fluid overload. Patient was admitted to this facility for short-term rehabilitation after the patient's recent hospitalization.  Patient has completed SNF rehabilitation and therapy has cleared the patient for discharge.  Reassessment of ongoing problem(s):  HTN: Pt 's HTN remains stable.  Denies CP, sob, DOE, pedal edema, headaches, dizziness or visual disturbances.  No complications from the medications currently being used.  Last BP : 125/56  PERIPHERAL NEUROPATHY: The peripheral neuropathy is stable. The patient denies pain in the feet, tingling, and numbness. No complications noted from the medication presently being used.  ANEMIA: The anemia has been stable. The patient denies fatigue, melena or hematochezia. No complications from the medications currently being used. 9/15 9.1   Past Medical History  Diagnosis Date  . Hypertension   . Diabetes mellitus without complication   . Hyperlipidemia   . Neuropathy   . Stroke 1989    no deficits  . Arthritis   . Cancer     Bladder  . Pulmonary embolism   . Decreased libido   . Hearing difficulty   . Tinnitus of both ears   . Bilateral cataracts   . Kidney stones   . ARF (acute renal failure)   . ESRD (end stage renal disease)      Reviewed.  No changes/see problem list  CURRENT MEDICATIONS: Reviewed per MAR/see medication list    Allergies  Allergen Reactions  . Ace Inhibitors Swelling  . Codeine Nausea And Vomiting  . Lisinopril Other (See Comments)   REACTION: Unknown     REVIEW OF SYSTEMS:  GENERAL: no change in appetite, no fatigue, no weight changes, no fever, chills or weakness RESPIRATORY: no cough, SOB, DOE, wheezing, hemoptysis CARDIAC: no chest pain, edema or palpitations GI: no abdominal pain, diarrhea, constipation, heart burn, nausea or vomiting  PHYSICAL EXAMINATION  GENERAL: no acute distress, normal body habitus EYES: conjunctivae normal, sclerae normal, normal eye lids NECK: supple, trachea midline, no neck masses, no thyroid tenderness, no thyromegaly LYMPHATICS: no LAN in the neck, no supraclavicular LAN RESPIRATORY: breathing is even & unlabored, BS CTAB CARDIAC: RRR, no murmur,no extra heart sounds, no edema GI: abdomen soft, normal BS, no masses, no tenderness, no hepatomegaly, no splenomegaly EXTREMITIES:  Able to move both lower extremities; right upper arm AV fistula + bruit and thrill PSYCHIATRIC: the patient is alert & oriented to person, affect & behavior appropriate  LABS/RADIOLOGY: 05/01/14 sodium 137 potassium 4.4 glucose 105 BUN 50 creatinine 4.08 calcium 8.6 WBC 7.7 hemoglobin 9.1 hematocrit 28.7 MCV 96.6 04-24-14 PT 17.2, INR 1.78, fibrinogen  613, BUN 34, glucose 138, creatinine 4.79 otherwise BMP normal, magnesium 1.8, phosphorus 3.1, hemoglobin 8.1, MCV 93.5 otherwise CBC normal    ASSESSMENT/PLAN:  Right hydroureteronephrosis-status post nephrostomy tube placement -  Followup with urologist. Anemia of chronic kidney disease-status post multiple transfusions - stable; continue iron Diabetes mellitus with renal complications-continue Lantus and Novolog Renovascular hypertension-well-controlled; continue Amlodipine and Lopressor Neuropathy-continue  lyrica End-stage renal disease-now on hemodialysis BPH-continue Flomax and Proscar Hyperlipidemia-continue Lipitor Hypomagnesemia-continue supplementation   I have filled out patient's discharge paperwork and written prescriptions.  Patient will  receive home health PT, OT and Nursing.  Total discharge time: Greater than 30 minutes  Discharge time involved coordination of the discharge process with social worker, nursing staff and therapy department. Medical justification for home health services verified.  CPT CODE: 88891  [name] [name], [facility] [phone]

## 2016-02-06 ENCOUNTER — Inpatient Hospital Stay
Admission: RE | Admit: 2016-02-06 | Discharge: 2016-02-26 | Disposition: A | Discharge status: Hospice | Payer: Medicare Other | Source: Other Acute Inpatient Hospital | Attending: Internal Medicine | Admitting: Internal Medicine

## 2016-02-06 DIAGNOSIS — J189 Pneumonia, unspecified organism: Secondary | ICD-10-CM

## 2016-02-06 DIAGNOSIS — Z452 Encounter for adjustment and management of vascular access device: Secondary | ICD-10-CM

## 2016-02-06 DIAGNOSIS — Z4659 Encounter for fitting and adjustment of other gastrointestinal appliance and device: Secondary | ICD-10-CM

## 2016-02-06 DIAGNOSIS — I82C19 Acute embolism and thrombosis of unspecified internal jugular vein: Secondary | ICD-10-CM

## 2016-02-06 DIAGNOSIS — J9690 Respiratory failure, unspecified, unspecified whether with hypoxia or hypercapnia: Secondary | ICD-10-CM

## 2016-02-06 DIAGNOSIS — Z931 Gastrostomy status: Secondary | ICD-10-CM

## 2016-02-07 ENCOUNTER — Other Ambulatory Visit (HOSPITAL_COMMUNITY): Payer: Medicare Other

## 2016-02-07 LAB — COMPREHENSIVE METABOLIC PANEL
ALBUMIN: 2 g/dL — AB (ref 3.5–5.0)
ALT: 5 U/L — ABNORMAL LOW (ref 17–63)
AST: 15 U/L (ref 15–41)
Alkaline Phosphatase: 117 U/L (ref 38–126)
Anion gap: 10 (ref 5–15)
BUN: 25 mg/dL — AB (ref 6–20)
CHLORIDE: 98 mmol/L — AB (ref 101–111)
CO2: 25 mmol/L (ref 22–32)
Calcium: 8.2 mg/dL — ABNORMAL LOW (ref 8.9–10.3)
Creatinine, Ser: 4.45 mg/dL — ABNORMAL HIGH (ref 0.61–1.24)
GFR calc Af Amer: 14 mL/min — ABNORMAL LOW (ref >60)
GFR, EST NON AFRICAN AMERICAN: 12 mL/min — AB (ref >60)
Glucose, Bld: 212 mg/dL — ABNORMAL HIGH (ref 65–99)
POTASSIUM: 3.9 mmol/L (ref 3.5–5.1)
Sodium: 133 mmol/L — ABNORMAL LOW (ref 135–145)
Total Bilirubin: 0.5 mg/dL (ref 0.3–1.2)
Total Protein: 6 g/dL — ABNORMAL LOW (ref 6.5–8.1)

## 2016-02-07 LAB — TSH: TSH: 3.235 u[IU]/mL (ref 0.350–4.500)

## 2016-02-07 LAB — PROTIME-INR
INR: 2.68 — AB (ref 0.00–1.49)
Prothrombin Time: 28.1 seconds — ABNORMAL HIGH (ref 11.6–15.2)

## 2016-02-07 LAB — CBC WITH DIFFERENTIAL/PLATELET
BASOS ABS: 0 10*3/uL (ref 0.0–0.1)
BASOS PCT: 0 %
Eosinophils Absolute: 0.2 10*3/uL (ref 0.0–0.7)
Eosinophils Relative: 2 %
HEMATOCRIT: 29.3 % — AB (ref 39.0–52.0)
HEMOGLOBIN: 9.2 g/dL — AB (ref 13.0–17.0)
LYMPHS ABS: 0.8 10*3/uL (ref 0.7–4.0)
LYMPHS PCT: 8 %
MCH: 28 pg (ref 26.0–34.0)
MCHC: 31.4 g/dL (ref 30.0–36.0)
MCV: 89.1 fL (ref 78.0–100.0)
MONO ABS: 0.8 10*3/uL (ref 0.1–1.0)
MONOS PCT: 9 %
NEUTROS ABS: 7.6 10*3/uL (ref 1.7–7.7)
NEUTROS PCT: 81 %
Platelets: 183 10*3/uL (ref 150–400)
RBC: 3.29 MIL/uL — AB (ref 4.22–5.81)
RDW: 15.7 % — AB (ref 11.5–15.5)
WBC: 9.3 10*3/uL (ref 4.0–10.5)

## 2016-02-07 LAB — T4, FREE: Free T4: 1.12 ng/dL (ref 0.61–1.12)

## 2016-02-07 LAB — MAGNESIUM: Magnesium: 1.7 mg/dL (ref 1.7–2.4)

## 2016-02-07 LAB — PHOSPHORUS: Phosphorus: 2.6 mg/dL (ref 2.5–4.6)

## 2016-02-07 LAB — PROCALCITONIN: PROCALCITONIN: 1.21 ng/mL

## 2016-02-08 LAB — RENAL FUNCTION PANEL
ANION GAP: 11 (ref 5–15)
Albumin: 1.9 g/dL — ABNORMAL LOW (ref 3.5–5.0)
BUN: 35 mg/dL — ABNORMAL HIGH (ref 6–20)
CHLORIDE: 96 mmol/L — AB (ref 101–111)
CO2: 29 mmol/L (ref 22–32)
Calcium: 8.8 mg/dL — ABNORMAL LOW (ref 8.9–10.3)
Creatinine, Ser: 5.5 mg/dL — ABNORMAL HIGH (ref 0.61–1.24)
GFR calc non Af Amer: 9 mL/min — ABNORMAL LOW (ref >60)
GFR, EST AFRICAN AMERICAN: 11 mL/min — AB (ref >60)
Glucose, Bld: 165 mg/dL — ABNORMAL HIGH (ref 65–99)
POTASSIUM: 3.6 mmol/L (ref 3.5–5.1)
Phosphorus: 3 mg/dL (ref 2.5–4.6)
Sodium: 136 mmol/L (ref 135–145)

## 2016-02-08 LAB — CBC WITH DIFFERENTIAL/PLATELET
Basophils Absolute: 0 10*3/uL (ref 0.0–0.1)
Basophils Relative: 0 %
EOS ABS: 0.1 10*3/uL (ref 0.0–0.7)
EOS PCT: 1 %
HCT: 29.5 % — ABNORMAL LOW (ref 39.0–52.0)
Hemoglobin: 9.3 g/dL — ABNORMAL LOW (ref 13.0–17.0)
LYMPHS ABS: 1.1 10*3/uL (ref 0.7–4.0)
LYMPHS PCT: 12 %
MCH: 27.3 pg (ref 26.0–34.0)
MCHC: 31.5 g/dL (ref 30.0–36.0)
MCV: 86.5 fL (ref 78.0–100.0)
MONO ABS: 0.6 10*3/uL (ref 0.1–1.0)
Monocytes Relative: 7 %
Neutro Abs: 7.5 10*3/uL (ref 1.7–7.7)
Neutrophils Relative %: 80 %
PLATELETS: 176 10*3/uL (ref 150–400)
RBC: 3.41 MIL/uL — ABNORMAL LOW (ref 4.22–5.81)
RDW: 15.7 % — AB (ref 11.5–15.5)
WBC: 9.3 10*3/uL (ref 4.0–10.5)

## 2016-02-08 LAB — PROTIME-INR
INR: 2.52 — ABNORMAL HIGH (ref 0.00–1.49)
Prothrombin Time: 26.9 seconds — ABNORMAL HIGH (ref 11.6–15.2)

## 2016-02-08 LAB — MAGNESIUM: Magnesium: 1.7 mg/dL (ref 1.7–2.4)

## 2016-02-09 LAB — CBC WITH DIFFERENTIAL/PLATELET
BASOS ABS: 0 10*3/uL (ref 0.0–0.1)
BASOS PCT: 0 %
Eosinophils Absolute: 0.2 10*3/uL (ref 0.0–0.7)
Eosinophils Relative: 2 %
HEMATOCRIT: 30.5 % — AB (ref 39.0–52.0)
HEMOGLOBIN: 9.5 g/dL — AB (ref 13.0–17.0)
LYMPHS PCT: 8 %
Lymphs Abs: 0.8 10*3/uL (ref 0.7–4.0)
MCH: 27.1 pg (ref 26.0–34.0)
MCHC: 31.1 g/dL (ref 30.0–36.0)
MCV: 86.9 fL (ref 78.0–100.0)
MONO ABS: 0.8 10*3/uL (ref 0.1–1.0)
MONOS PCT: 8 %
NEUTROS ABS: 8.8 10*3/uL — AB (ref 1.7–7.7)
NEUTROS PCT: 82 %
Platelets: 192 10*3/uL (ref 150–400)
RBC: 3.51 MIL/uL — ABNORMAL LOW (ref 4.22–5.81)
RDW: 15.9 % — AB (ref 11.5–15.5)
WBC: 10.7 10*3/uL — ABNORMAL HIGH (ref 4.0–10.5)

## 2016-02-09 LAB — RENAL FUNCTION PANEL
ALBUMIN: 2 g/dL — AB (ref 3.5–5.0)
ANION GAP: 12 (ref 5–15)
BUN: 44 mg/dL — ABNORMAL HIGH (ref 6–20)
CHLORIDE: 96 mmol/L — AB (ref 101–111)
CO2: 26 mmol/L (ref 22–32)
Calcium: 8.8 mg/dL — ABNORMAL LOW (ref 8.9–10.3)
Creatinine, Ser: 6.51 mg/dL — ABNORMAL HIGH (ref 0.61–1.24)
GFR calc Af Amer: 9 mL/min — ABNORMAL LOW (ref >60)
GFR, EST NON AFRICAN AMERICAN: 7 mL/min — AB (ref >60)
Glucose, Bld: 123 mg/dL — ABNORMAL HIGH (ref 65–99)
PHOSPHORUS: 3.2 mg/dL (ref 2.5–4.6)
POTASSIUM: 4 mmol/L (ref 3.5–5.1)
Sodium: 134 mmol/L — ABNORMAL LOW (ref 135–145)

## 2016-02-09 LAB — HEMOGLOBIN A1C
Hgb A1c MFr Bld: 7.3 % — ABNORMAL HIGH (ref 4.8–5.6)
Mean Plasma Glucose: 163 mg/dL

## 2016-02-09 LAB — PROTIME-INR
INR: 2.53 — AB (ref 0.00–1.49)
PROTHROMBIN TIME: 27 s — AB (ref 11.6–15.2)

## 2016-02-09 LAB — MAGNESIUM: MAGNESIUM: 1.7 mg/dL (ref 1.7–2.4)

## 2016-02-09 NOTE — Consult Note (Addendum)
Date: 02/09/2016                  Patient Name:  [name] [name]  MRN: [id number]  DOB: [birth year]  Age / Sex: 75 y.o., male         PCP: Cathie Olden, MD                 Service Requesting Consult: Internal medicine                 Reason for Consult: ESRD, dialysis            History of Present Illness: Patient is a 75 y.o. male with medical problems of ESRD, who was [facility] 02[facility]06/2016.   Patient is not able to provide any meaningful history. All information is obtained from the chart. Patient was hospitalized for confusion at [facility], then transferred to [facility]. He was d/c after 3 weeks but re-admitted for healthcare associated pneumonia.  Nephrology requested to arrange for HD Last date of dialysis unknown   Medications: Outpatient medications: Prescriptions prior to admission  Medication Sig Dispense Refill Last Dose  . acetaminophen (TYLENOL) 500 MG tablet Take 1,000 mg by mouth 3 (three) times daily as needed.   Taking  . amLODipine (NORVASC) 10 MG tablet Take 10 mg by mouth daily.   Taking  . aspirin EC 81 MG tablet Take 81 mg by mouth daily.   Taking  . atorvastatin (LIPITOR) 20 MG tablet Take 20 mg by mouth daily.   Taking  . b complex vitamins tablet Take 1 tablet by mouth every other day.   Taking  . ferrous sulfate 325 (65 FE) MG tablet Take 325 mg by mouth 2 (two) times daily with a meal.   Taking  . finasteride (PROSCAR) 5 MG tablet Take 5 mg by mouth daily.   Taking  . insulin aspart (NOVOLOG) 100 UNIT/ML injection Moderate sliding scale with lunch and dinner 150-220=3u, 201-250=5u, 251-300= 7u, 301-350=9u, 351-400= 11u, greater than 400 contact physician   Taking  . lidocaine-prilocaine (EMLA) cream Apply 1 application topically as needed (For Dialysis).   Taking  . magnesium oxide (MAG-OX) 400 MG tablet Take 400 mg by mouth 2 (two) times daily.   Taking  . metoprolol tartrate (LOPRESSOR) 25 MG tablet Take  12.5 mg by mouth 2 (two) times daily.   Taking  . polyethylene glycol (MIRALAX / GLYCOLAX) packet Take 17 g by mouth daily as needed.   Taking  . pregabalin (LYRICA) 75 MG capsule Take 75 mg by mouth 2 (two) times daily.   Taking  . sennosides-docusate sodium (SENOKOT-S) 8.6-50 MG tablet Take 1 tablet by mouth 2 (two) times daily.   Taking  . sevelamer (RENAGEL) 800 MG tablet Take 800 mg by mouth 3 (three) times daily with meals.   Taking  . sodium bicarbonate 325 MG tablet Take 650 mg by mouth 3 (three) times daily.   Taking  . Tamsulosin HCl (FLOMAX) 0.4 MG CAPS Take 0.4 mg by mouth daily.   Taking  . warfarin (COUMADIN) 5 MG tablet 2 tablets (10 mg) by mouth daily EXCEPT on M/W/F 1.5 tablets (7.5 mg)   Taking    Current medications: No current facility-administered medications for this encounter.      Allergies: Allergies  Allergen Reactions  . Ace Inhibitors Swelling  . Codeine Nausea And Vomiting  . Lisinopril Other (See Comments)    REACTION: Unknown      Past Medical  History: Past Medical History  Diagnosis Date  . Hypertension   . Diabetes mellitus without complication   . Hyperlipidemia   . Neuropathy   . Stroke 1989    no deficits  . Arthritis   . Cancer     Bladder  . Pulmonary embolism   . Decreased libido   . Hearing difficulty   . Tinnitus of both ears   . Bilateral cataracts   . Kidney stones   . ARF (acute renal failure)   . ESRD (end stage renal disease)      Past Surgical History: Past Surgical History  Procedure Laterality Date  . Cholecystectomy    . Bladder cancer  03/30/2013  . Lumbar laminectomy    . 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 Femoral bypass  07/13/2011  . Ureteroscopy  08/03/2012    Procedure: URETEROSCOPY;  Surgeon: Marissa Nestle, MD;  Location: [facility];  Service: Urology;  Laterality: Right;  . Cystoscopy w/ retrogrades  08/03/2012    Procedure: CYSTOSCOPY WITH RETROGRADE PYELOGRAM;  Surgeon:  Marissa Nestle, MD;  Location: [facility];  Service: Urology;  Laterality: Bilateral;  . Biopsy  08/03/2012    Procedure: BIOPSY;  Surgeon: Marissa Nestle, MD;  Location: [facility];  Service: Urology;  Laterality: Right;  distal third of right ureter  . Cystoscopy with retrograde pyelogram, ureteroscopy and stent placement  03/30/2013  . Cystostomy w/ bladder biopsy  03/30/2013  . Cystoscopy w/ retrogrades  08/31/2013  . Av fistula placement  11/29/2013  . Removal of a dialysis catheter  11/29/2013  . Transurethral resection of bladder tumor  01/11/2014  . Cystostomy w/ bladder biopsy  01/11/2014  . Ureteroscopy  01/11/14     Family History: Family History  Problem Relation Age of Onset  . Diabetes type II Mother   . Diabetes type II Paternal Grandmother   . Diabetes type II Maternal Grandmother   . Hypertension Mother   . Hyperlipidemia Mother   . Lung cancer Father      Social History: Social History   Social History  . Marital Status: Widowed    Spouse Name: N/A  . Number of Children: N/A  . Years of Education: N/A   Occupational History  . Not on file.   Social History Main Topics  . Smoking status: Current Every Day Smoker -- 0.50 packs/day for 55 years    Types: Cigarettes  . Smokeless tobacco: Not on file  . Alcohol Use: Yes  . Drug Use: No  . Sexual Activity: Yes    Birth Control/ Protection: None   Other Topics Concern  . Not on file   Social History Narrative  . No narrative on file     Review of Systems: unavailable Gen:  HEENT:  CV:  Resp:  GI: GU :  MS:  Derm:   Psych: Heme:  Neuro:  Endocrine  Vital Signs: BP = 124/54  P= 79  RR 9  No intake or output data in the 24 hours ending 02/09/16 1705  Weight trends: There were no vitals filed for this visit.  Physical Exam: General:  NAD  HEENT Anicteric, moist mucus membranes  Neck:  supple  Lungs: Clear b/l, normal effort  Heart::  irregular  Abdomen: Soft, NT  Extremities:  no peripheral  edema  Neurologic: Sleepy, answering only few questions  Skin: No acute rashes  Access: Rt arm AVF  Lab results: Basic Metabolic Panel:  Recent Labs Lab 02/07/16 0559 02/07/16 1354 02/08/16 0527 02/09/16 0624  NA  --  133* 136 134*  K  --  3.9 3.6 4.0  CL  --  98* 96* 96*  CO2  --  25 29 26   GLUCOSE  --  212* 165* 123*  BUN  --  25* 35* 44*  CREATININE  --  4.45* 5.50* 6.51*  CALCIUM  --  8.2* 8.8* 8.8*  MG 1.7  --  1.7 1.7  PHOS 2.6  --  3.0 3.2    Liver Function Tests:  Recent Labs Lab 02/07/16 1354  02/09/16 0624  AST 15  --   --   ALT 5*  --   --   ALKPHOS 117  --   --   BILITOT 0.5  --   --   PROT 6.0*  --   --   ALBUMIN 2.0*  < > 2.0*  < > = values in this interval not displayed. No results for input(s): LIPASE, AMYLASE in the last 168 hours. No results for input(s): AMMONIA in the last 168 hours.  CBC:  Recent Labs Lab 02/08/16 0527 02/09/16 0624  WBC 9.3 10.7*  NEUTROABS 7.5 8.8*  HGB 9.3* 9.5*  HCT 29.5* 30.5*  MCV 86.5 86.9  PLT 176 192    Cardiac Enzymes: No results for input(s): CKTOTAL, TROPONINI in the last 168 hours.  BNP: Invalid input(s): POCBNP  CBG: No results for input(s): GLUCAP in the last 168 hours.  Microbiology: No results found for this or any previous visit (from the past 720 hour(s)).   Coagulation Studies:  Recent Labs  02/07/16 0559 02/08/16 0527 02/09/16 0624  LABPROT 28.1* 26.9* 27.0*  INR 2.68* 2.52* 2.53*    Urinalysis: No results for input(s): COLORURINE, LABSPEC, PHURINE, GLUCOSEU, HGBUR, BILIRUBINUR, KETONESUR, PROTEINUR, UROBILINOGEN, NITRITE, LEUKOCYTESUR in the last 72 hours.  Invalid input(s): APPERANCEUR      Imaging:  No results found.   Assessment & Plan: Pt is a 74 y.o. yo male with a PMHX of ESRD, DVT with pulmonary emboli, on chronic anticoagulation, history of stroke, moderate-to-severe pulmonary hypertension, end-stage renal disease on dialysis, bilateral severe  hydronephrosis, severe vascular disease with carotid artery stenosis and peripheral vascular disease with femoral-popliteal bypass, type 2 diabetes, BPH, HTN, A fib, T2 DM, carcinoma in situ of bladder, treated with BCG for recurrent carcinoma in situ. He has had indwelling bilateral ureteral stents. In the past, he has had failed placements of stents and ultimately had nephrostomy tubes, which were then converted to internal ureteral stents. He has had atypical cells within his right renal pelvis and atypical cells on a right ureteral biopsy. He has had carcinoma in situ within his bladder   1. ESRD Will place on T-T-S schedule  2. AOCKD - avoid Aranesp due to bladder Ca  3. SHPTH - check and monitor phos - hold sevelamer  4. C Diff colitis - treatment as per primary team

## 2016-02-10 LAB — RENAL FUNCTION PANEL
ANION GAP: 12 (ref 5–15)
Albumin: 1.9 g/dL — ABNORMAL LOW (ref 3.5–5.0)
BUN: 57 mg/dL — ABNORMAL HIGH (ref 6–20)
CO2: 27 mmol/L (ref 22–32)
Calcium: 8.6 mg/dL — ABNORMAL LOW (ref 8.9–10.3)
Chloride: 96 mmol/L — ABNORMAL LOW (ref 101–111)
Creatinine, Ser: 7.69 mg/dL — ABNORMAL HIGH (ref 0.61–1.24)
GFR calc non Af Amer: 6 mL/min — ABNORMAL LOW (ref >60)
GFR, EST AFRICAN AMERICAN: 7 mL/min — AB (ref >60)
GLUCOSE: 192 mg/dL — AB (ref 65–99)
PHOSPHORUS: 3.1 mg/dL (ref 2.5–4.6)
POTASSIUM: 4.6 mmol/L (ref 3.5–5.1)
SODIUM: 135 mmol/L (ref 135–145)

## 2016-02-10 LAB — CBC
HEMATOCRIT: 30.6 % — AB (ref 39.0–52.0)
HEMOGLOBIN: 9.5 g/dL — AB (ref 13.0–17.0)
MCH: 27.5 pg (ref 26.0–34.0)
MCHC: 31 g/dL (ref 30.0–36.0)
MCV: 88.4 fL (ref 78.0–100.0)
Platelets: 190 10*3/uL (ref 150–400)
RBC: 3.46 MIL/uL — AB (ref 4.22–5.81)
RDW: 16.1 % — ABNORMAL HIGH (ref 11.5–15.5)
WBC: 10.4 10*3/uL (ref 4.0–10.5)

## 2016-02-10 LAB — PROTIME-INR
INR: 2.47 — ABNORMAL HIGH (ref 0.00–1.49)
PROTHROMBIN TIME: 26.5 s — AB (ref 11.6–15.2)

## 2016-02-11 ENCOUNTER — Other Ambulatory Visit (HOSPITAL_COMMUNITY): Payer: Medicare Other

## 2016-02-11 LAB — HEPATITIS B E ANTIBODY: Hep B E Ab: NEGATIVE

## 2016-02-11 LAB — PROTIME-INR
INR: 2.39 — AB (ref 0.00–1.49)
PROTHROMBIN TIME: 25.8 s — AB (ref 11.6–15.2)

## 2016-02-11 LAB — HEPATITIS B SURFACE ANTIGEN: Hepatitis B Surface Ag: NEGATIVE

## 2016-02-11 LAB — HEPATITIS B CORE ANTIBODY, IGM: HEP B C IGM: NEGATIVE

## 2016-02-11 NOTE — Progress Notes (Signed)
Subjective:  Patient continues to be very sleepy Last HD was Tuesday. 460 cc was removed. BP remained low during treatment Oral intake remains poor No taking meds   Objective:  Vital signs in last 24 hours:   T 98.4  R 12  P 83  BP 117/49  Weight change:  There were no vitals filed for this visit.  Intake/Output:   No intake or output data in the 24 hours ending 02/11/16 1608   Physical Exam: General: NAD, laying in bed  HEENT anicteric  Neck supple  Pulm/lungs Normal effort, clear ant/lat  CVS/Heart No rub  Abdomen:  Soft, NT  Extremities: No peripheral edema  Neurologic: Somnolent, able to answer only 1-2 questions  Skin: No acute rashes  Access: AVF       Basic Metabolic Panel:   Recent Labs Lab 02/07/16 0559  02/07/16 1354 02/08/16 0527 02/09/16 0624 02/10/16 0635  NA  --   --  133* 136 134* 135  K  --   --  3.9 3.6 4.0 4.6  CL  --   --  98* 96* 96* 96*  CO2  --   --  25 29 26 27   GLUCOSE  --   --  212* 165* 123* 192*  BUN  --   --  25* 35* 44* 57*  CREATININE  --   --  4.45* 5.50* 6.51* 7.69*  CALCIUM  --   < > 8.2* 8.8* 8.8* 8.6*  MG 1.7  --   --  1.7 1.7  --   PHOS 2.6  --   --  3.0 3.2 3.1  < > = values in this interval not displayed.   CBC:  Recent Labs Lab 02/07/16 0559 02/08/16 0527 02/09/16 0624 02/10/16 0635  WBC 9.3 9.3 10.7* 10.4  NEUTROABS 7.6 7.5 8.8*  --   HGB 9.2* 9.3* 9.5* 9.5*  HCT 29.3* 29.5* 30.5* 30.6*  MCV 89.1 86.5 86.9 88.4  PLT 183 176 192 190      Microbiology:  No results found for this or any previous visit (from the past 720 hour(s)).  Coagulation Studies:  Recent Labs  02/09/16 0624 02/10/16 0635 02/11/16 0736  LABPROT 27.0* 26.5* 25.8*  INR 2.53* 2.47* 2.39*    Urinalysis: No results for input(s): COLORURINE, LABSPEC, PHURINE, GLUCOSEU, HGBUR, BILIRUBINUR, KETONESUR, PROTEINUR, UROBILINOGEN, NITRITE, LEUKOCYTESUR in the last 72 hours.  Invalid input(s): APPERANCEUR    Imaging: Dg Chest  Port 1 View  02/11/2016  CLINICAL DATA:  Pneumonia, followup, smoking history EXAM: PORTABLE CHEST 1 VIEW COMPARISON:  Chest x-ray of 02/07/2016 FINDINGS: There has been slight worsening of airspace disease primarily at the lung bases bilaterally. Slightly prominent markings also are noted in the upper lung fields right greater than left. Mild cardiomegaly is stable. IMPRESSION: Slight worsening of patchy airspace disease bilaterally most consistent with worsening of pneumonia. Electronically Signed   By: Ivar Drape M.D.   On: 02/11/2016 08:10     Medications:       Assessment/ Plan:  75 y.o.african [place] male with a PMHX of ESRD, DVT with pulmonary emboli, on chronic anticoagulation, history of stroke, moderate-to-severe pulmonary hypertension, end-stage renal disease on dialysis, bilateral severe hydronephrosis, severe vascular disease with carotid artery stenosis and peripheral vascular disease with femoral-popliteal bypass, type 2 diabetes, BPH, HTN, A fib, T2 DM, carcinoma in situ of bladder, treated with BCG for recurrent carcinoma in situ. He has had indwelling bilateral ureteral stents. In the past, he has had failed  placements of stents and ultimately had nephrostomy tubes, which were then converted to internal ureteral stents. He has had atypical cells within his right renal pelvis and atypical cells on a right ureteral biopsy. He has had carcinoma in situ within his bladder   1. ESRD Will place on T-T-S schedule  2. AOCKD - avoid Aranesp due to bladder Ca  3. SHPTH - check and monitor phos - hold sevelamer  4. C Diff colitis - treatment as per primary team   LOS:  [name] [name] 6/21/20174:08 PM

## 2016-02-12 ENCOUNTER — Other Ambulatory Visit (HOSPITAL_COMMUNITY): Payer: Medicare Other

## 2016-02-12 LAB — CBC
HEMATOCRIT: 31.6 % — AB (ref 39.0–52.0)
HEMOGLOBIN: 9.5 g/dL — AB (ref 13.0–17.0)
MCH: 27.3 pg (ref 26.0–34.0)
MCHC: 30.1 g/dL (ref 30.0–36.0)
MCV: 90.8 fL (ref 78.0–100.0)
Platelets: 194 10*3/uL (ref 150–400)
RBC: 3.48 MIL/uL — ABNORMAL LOW (ref 4.22–5.81)
RDW: 16.2 % — AB (ref 11.5–15.5)
WBC: 10.2 10*3/uL (ref 4.0–10.5)

## 2016-02-12 LAB — RENAL FUNCTION PANEL
ALBUMIN: 2.1 g/dL — AB (ref 3.5–5.0)
Anion gap: 14 (ref 5–15)
BUN: 42 mg/dL — AB (ref 6–20)
CALCIUM: 8.9 mg/dL (ref 8.9–10.3)
CO2: 27 mmol/L (ref 22–32)
Chloride: 96 mmol/L — ABNORMAL LOW (ref 101–111)
Creatinine, Ser: 6.58 mg/dL — ABNORMAL HIGH (ref 0.61–1.24)
GFR calc Af Amer: 9 mL/min — ABNORMAL LOW (ref >60)
GFR, EST NON AFRICAN AMERICAN: 7 mL/min — AB (ref >60)
Glucose, Bld: 100 mg/dL — ABNORMAL HIGH (ref 65–99)
PHOSPHORUS: 4 mg/dL (ref 2.5–4.6)
POTASSIUM: 5 mmol/L (ref 3.5–5.1)
SODIUM: 137 mmol/L (ref 135–145)

## 2016-02-12 LAB — PROTIME-INR
INR: 2.34 — AB (ref 0.00–1.49)
PROTHROMBIN TIME: 25.4 s — AB (ref 11.6–15.2)

## 2016-02-12 MED ORDER — DIATRIZOATE MEGLUMINE & SODIUM 66-10 % PO SOLN
ORAL | Status: DC
Start: 2016-02-12 — End: 2016-02-12
  Filled 2016-02-12: qty 30

## 2016-02-13 ENCOUNTER — Other Ambulatory Visit (HOSPITAL_COMMUNITY): Payer: Medicare Other

## 2016-02-13 LAB — RENAL FUNCTION PANEL
ALBUMIN: 1.6 g/dL — AB (ref 3.5–5.0)
Anion gap: 10 (ref 5–15)
BUN: 33 mg/dL — AB (ref 6–20)
CHLORIDE: 100 mmol/L — AB (ref 101–111)
CO2: 26 mmol/L (ref 22–32)
CREATININE: 5.29 mg/dL — AB (ref 0.61–1.24)
Calcium: 7.9 mg/dL — ABNORMAL LOW (ref 8.9–10.3)
GFR calc Af Amer: 11 mL/min — ABNORMAL LOW (ref >60)
GFR, EST NON AFRICAN AMERICAN: 10 mL/min — AB (ref >60)
Glucose, Bld: 144 mg/dL — ABNORMAL HIGH (ref 65–99)
Phosphorus: 3.4 mg/dL (ref 2.5–4.6)
Potassium: 4.9 mmol/L (ref 3.5–5.1)
Sodium: 136 mmol/L (ref 135–145)

## 2016-02-13 LAB — CBC
HCT: 25 % — ABNORMAL LOW (ref 39.0–52.0)
Hemoglobin: 7.8 g/dL — ABNORMAL LOW (ref 13.0–17.0)
MCH: 27.6 pg (ref 26.0–34.0)
MCHC: 31.2 g/dL (ref 30.0–36.0)
MCV: 88.3 fL (ref 78.0–100.0)
PLATELETS: 179 10*3/uL (ref 150–400)
RBC: 2.83 MIL/uL — ABNORMAL LOW (ref 4.22–5.81)
RDW: 16.3 % — AB (ref 11.5–15.5)
WBC: 9.6 10*3/uL (ref 4.0–10.5)

## 2016-02-13 LAB — PROTIME-INR
INR: 3.23 — ABNORMAL HIGH (ref 0.00–1.49)
Prothrombin Time: 32.3 seconds — ABNORMAL HIGH (ref 11.6–15.2)

## 2016-02-13 LAB — VANCOMYCIN, RANDOM: VANCOMYCIN RM: 19 ug/mL

## 2016-02-13 NOTE — Progress Notes (Signed)
Subjective:  Patient continues to be very sleepy, but wakes up and answers questions yes/no. HD had to be stopped early because of tachycardia to 150's Today, appears well   Objective:  Vital signs in last 24 hours:   T 99.1  R 11  P 89  BP 106/55   Intake/Output:    Physical Exam: General: NAD, laying in bed  HEENT anicteric  Neck supple  Pulm/lungs Normal effort, clear ant/lat  CVS/Heart No rub, irregular  Abdomen:  Soft, NT  Extremities: No peripheral edema  Neurologic: Somnolent, able to answer only 1-2 questions, delirius  Skin: No acute rashes  Access: Rt arm AVF       Basic Metabolic Panel:   Recent Labs Lab 02/07/16 0559  02/08/16 0527 02/09/16 0624 02/10/16 0635 02/12/16 0547 02/13/16 0446  NA  --   < > 136 134* 135 137 136  K  --   < > 3.6 4.0 4.6 5.0 4.9  CL  --   < > 96* 96* 96* 96* 100*  CO2  --   < > 29 26 27 27 26   GLUCOSE  --   < > 165* 123* 192* 100* 144*  BUN  --   < > 35* 44* 57* 42* 33*  CREATININE  --   < > 5.50* 6.51* 7.69* 6.58* 5.29*  CALCIUM  --   < > 8.8* 8.8* 8.6* 8.9 7.9*  MG 1.7  --  1.7 1.7  --   --   --   PHOS 2.6  --  3.0 3.2 3.1 4.0 3.4  < > = values in this interval not displayed.   CBC:  Recent Labs Lab 02/07/16 0559 02/08/16 0527 02/09/16 0624 02/10/16 0635 02/12/16 0548 02/13/16 0446  WBC 9.3 9.3 10.7* 10.4 10.2 9.6  NEUTROABS 7.6 7.5 8.8*  --   --   --   HGB 9.2* 9.3* 9.5* 9.5* 9.5* 7.8*  HCT 29.3* 29.5* 30.5* 30.6* 31.6* 25.0*  MCV 89.1 86.5 86.9 88.4 90.8 88.3  PLT 183 176 192 190 194 179      Microbiology:  Recent Results (from the past 720 hour(s))  Culture, blood (routine x 2)     Status: None (Preliminary result)   Collection Time: 02/12/16 10:45 AM  Result Value Ref Range Status   Specimen Description BLOOD LEFT ARM  Final   Special Requests BOTTLES DRAWN AEROBIC ONLY 10CC  Final   Culture NO GROWTH 1 DAY  Final   Report Status PENDING  Incomplete  Culture, blood (routine x 2)     Status:  None (Preliminary result)   Collection Time: 02/12/16 10:55 AM  Result Value Ref Range Status   Specimen Description BLOOD LEFT HAND  Final   Special Requests IN PEDIATRIC BOTTLE 3CC  Final   Culture NO GROWTH 1 DAY  Final   Report Status PENDING  Incomplete    Coagulation Studies:  Recent Labs  02/11/16 0736 02/12/16 0614 02/13/16 0446  LABPROT 25.8* 25.4* 32.3*  INR 2.39* 2.34* 3.23*    Urinalysis: No results for input(s): COLORURINE, LABSPEC, PHURINE, GLUCOSEU, HGBUR, BILIRUBINUR, KETONESUR, PROTEINUR, UROBILINOGEN, NITRITE, LEUKOCYTESUR in the last 72 hours.  Invalid input(s): APPERANCEUR    Imaging: Dg Chest Port 1 View  02/12/2016  CLINICAL DATA:  Encounter for central line placement EXAM: PORTABLE CHEST 1 VIEW COMPARISON:  02/11/2016 FINDINGS: There is a right IJ catheter with tip in the right atrium. No pneumothorax identified. Nasogastric tube tip is in the stomach. Heart size  is normal. There is a right pleural effusion and moderate interstitial edema compatible with CHF. Atelectasis is noted within both lung bases IMPRESSION: 1. Right pleural effusion and moderate pulmonary edema compatible with CHF. 2. Bibasilar atelectasis. Electronically Signed   By: Kerby Moors M.D.   On: 02/12/2016 12:57   Dg Abd Portable 1v  02/13/2016  CLINICAL DATA:  NG tube placement EXAM: PORTABLE ABDOMEN - 1 VIEW COMPARISON:  02/12/2016 FINDINGS: NG tube tip is in the mid stomach. Prior cholecystectomy. Nonobstructive bowel gas pattern. IMPRESSION: NG tube tip in the mid stomach. Electronically Signed   By: Rolm Baptise M.D.   On: 02/13/2016 12:19   Dg Abd Portable 1v  02/12/2016  CLINICAL DATA:  Patient status post NG tube placement. EXAM: PORTABLE ABDOMEN - 1 VIEW COMPARISON:  Chest radiograph 02/01/2016. FINDINGS: Enteric tube tip and side-port project over the stomach. Coil clips within the left upper quadrant. Unremarkable bowel gas pattern. Lumbar spine degenerative changes. Large  amount of stool within the rectum. IMPRESSION: Enteric tube tip and side-port project over the stomach. Large amount of stool in the rectum as can be seen with constipation. Aortic atherosclerotic calcifications. Electronically Signed   By: Lovey Newcomer M.D.   On: 02/12/2016 13:01     Medications:       Assessment/ Plan:  75 y.o.african [place] male with a PMHX of ESRD, DVT with pulmonary emboli, on chronic anticoagulation, history of stroke, moderate-to-severe pulmonary hypertension, end-stage renal disease on dialysis, bilateral severe hydronephrosis, severe vascular disease with carotid artery stenosis and peripheral vascular disease with femoral-popliteal bypass, type 2 diabetes, BPH, HTN, A fib, T2 DM, carcinoma in situ of bladder, treated with BCG for recurrent carcinoma in situ. He has had indwelling bilateral ureteral stents. In the past, he has had failed placements of stents and ultimately had nephrostomy tubes, which were then converted to internal ureteral stents. He has had atypical cells within his right renal pelvis and atypical cells on a right ureteral biopsy. He has had carcinoma in situ within his bladder   1. ESRD continue T-T-S schedule  2. AOCKD - avoid Aranesp due to bladder Ca  3. SHPTH - check and monitor phos - hold sevelamer since oral intake is poor  4. C Diff colitis - treatment as per primary team   LOS:  [place] 6/23/20174:07 PM

## 2016-02-14 LAB — RENAL FUNCTION PANEL
ALBUMIN: 1.6 g/dL — AB (ref 3.5–5.0)
Anion gap: 12 (ref 5–15)
BUN: 48 mg/dL — AB (ref 6–20)
CALCIUM: 8.4 mg/dL — AB (ref 8.9–10.3)
CO2: 25 mmol/L (ref 22–32)
Chloride: 98 mmol/L — ABNORMAL LOW (ref 101–111)
Creatinine, Ser: 5.95 mg/dL — ABNORMAL HIGH (ref 0.61–1.24)
GFR calc Af Amer: 10 mL/min — ABNORMAL LOW (ref >60)
GFR calc non Af Amer: 8 mL/min — ABNORMAL LOW (ref >60)
GLUCOSE: 190 mg/dL — AB (ref 65–99)
PHOSPHORUS: 3.9 mg/dL (ref 2.5–4.6)
Potassium: 5 mmol/L (ref 3.5–5.1)
SODIUM: 135 mmol/L (ref 135–145)

## 2016-02-14 LAB — CBC
HCT: 29.1 % — ABNORMAL LOW (ref 39.0–52.0)
HEMOGLOBIN: 9.1 g/dL — AB (ref 13.0–17.0)
MCH: 27.1 pg (ref 26.0–34.0)
MCHC: 31.3 g/dL (ref 30.0–36.0)
MCV: 86.6 fL (ref 78.0–100.0)
Platelets: 183 10*3/uL (ref 150–400)
RBC: 3.36 MIL/uL — ABNORMAL LOW (ref 4.22–5.81)
RDW: 16.2 % — AB (ref 11.5–15.5)
WBC: 10.5 10*3/uL (ref 4.0–10.5)

## 2016-02-14 LAB — PROTIME-INR
INR: 2.95 — ABNORMAL HIGH (ref 0.00–1.49)
PROTHROMBIN TIME: 30.2 s — AB (ref 11.6–15.2)

## 2016-02-15 LAB — PROTIME-INR
INR: 2.07 — ABNORMAL HIGH (ref 0.00–1.49)
Prothrombin Time: 23.1 seconds — ABNORMAL HIGH (ref 11.6–15.2)

## 2016-02-15 LAB — VANCOMYCIN, TROUGH: Vancomycin Tr: 23 ug/mL — ABNORMAL HIGH (ref 10.0–20.0)

## 2016-02-16 LAB — VANCOMYCIN, TROUGH: Vancomycin Tr: 20 ug/mL (ref 10.0–20.0)

## 2016-02-16 LAB — PROTIME-INR
INR: 1.64 — AB (ref 0.00–1.49)
Prothrombin Time: 19.4 seconds — ABNORMAL HIGH (ref 11.6–15.2)

## 2016-02-16 NOTE — Progress Notes (Signed)
Subjective:  Patient rather somnolent at the moment but is arousable. He continues dialysis on a Tuesday, Thursday, Saturday schedule.  Objective:  Vital signs in last 24 hours:  Temperature 98.9 pulse 75 respirations 19 blood pressure 101/50   Intake/Output:    Physical Exam: General: NAD, laying in bed  HEENT anicteric  Neck supple  Pulm/lungs Normal effort, clear ant/lat  CVS/Heart No rub, irregular  Abdomen:  Soft, NT  Extremities: No peripheral edema  Neurologic: Somnolent, but arousable. Not answering questions.  Skin: No acute rashes  Access: Rt arm AVF       Basic Metabolic Panel:   Recent Labs Lab 02/10/16 0635 02/12/16 0547 02/13/16 0446 02/14/16 0616  NA 135 137 136 135  K 4.6 5.0 4.9 5.0  CL 96* 96* 100* 98*  CO2 27 27 26 25   GLUCOSE 192* 100* 144* 190*  BUN 57* 42* 33* 48*  CREATININE 7.69* 6.58* 5.29* 5.95*  CALCIUM 8.6* 8.9 7.9* 8.4*  PHOS 3.1 4.0 3.4 3.9     CBC:  Recent Labs Lab 02/10/16 0635 02/12/16 0548 02/13/16 0446 02/14/16 0616  WBC 10.4 10.2 9.6 10.5  HGB 9.5* 9.5* 7.8* 9.1*  HCT 30.6* 31.6* 25.0* 29.1*  MCV 88.4 90.8 88.3 86.6  PLT 190 194 179 183      Microbiology:  Recent Results (from the past 720 hour(s))  Culture, blood (routine x 2)     Status: None (Preliminary result)   Collection Time: 02/12/16 10:45 AM  Result Value Ref Range Status   Specimen Description BLOOD LEFT ARM  Final   Special Requests BOTTLES DRAWN AEROBIC ONLY 10CC  Final   Culture NO GROWTH 4 DAYS  Final   Report Status PENDING  Incomplete  Culture, blood (routine x 2)     Status: None (Preliminary result)   Collection Time: 02/12/16 10:55 AM  Result Value Ref Range Status   Specimen Description BLOOD LEFT HAND  Final   Special Requests IN PEDIATRIC BOTTLE 3CC  Final   Culture NO GROWTH 4 DAYS  Final   Report Status PENDING  Incomplete    Coagulation Studies:  Recent Labs  02/14/16 0616 02/15/16 0632 02/16/16 1016  LABPROT 30.2*  23.1* 19.4*  INR 2.95* 2.07* 1.64*    Urinalysis: No results for input(s): COLORURINE, LABSPEC, PHURINE, GLUCOSEU, HGBUR, BILIRUBINUR, KETONESUR, PROTEINUR, UROBILINOGEN, NITRITE, LEUKOCYTESUR in the last 72 hours.  Invalid input(s): APPERANCEUR    Imaging: No results found.   Medications:       Assessment/ Plan:  75 y.o.african [place] male with a PMHX of ESRD, DVT with pulmonary emboli, on chronic anticoagulation, history of stroke, moderate-to-severe pulmonary hypertension, end-stage renal disease on dialysis, bilateral severe hydronephrosis, severe vascular disease with carotid artery stenosis and peripheral vascular disease with femoral-popliteal bypass, type 2 diabetes, BPH, HTN, A fib, T2 DM, carcinoma in situ of bladder, treated with BCG for recurrent carcinoma in situ. He has had indwelling bilateral ureteral stents. In the past, he has had failed placements of stents and ultimately had nephrostomy tubes, which were then converted to internal ureteral stents. He has had atypical cells within his right renal pelvis and atypical cells on a right ureteral biopsy. He has had carcinoma in situ within his bladder   1. ESRD Patient due for hemodialysis tomorrow. We will prepare orders.  2. AOCKD -last hemoglobin was 9.1. Continue to avoid Aranesp do 2 history of bladder cancer.  3. SHPTH - phosphorus are acceptable at 3.9.  Continue to hold binder therapy  given poor by mouth intake.  4. C Diff colitis - treatment as per primary team   LOS:  [name] [name] 6/26/20174:26 PM

## 2016-02-17 ENCOUNTER — Other Ambulatory Visit (HOSPITAL_COMMUNITY): Payer: Medicare Other

## 2016-02-17 LAB — RENAL FUNCTION PANEL
ALBUMIN: 1.5 g/dL — AB (ref 3.5–5.0)
Anion gap: 11 (ref 5–15)
BUN: 74 mg/dL — AB (ref 6–20)
CALCIUM: 8.7 mg/dL — AB (ref 8.9–10.3)
CO2: 25 mmol/L (ref 22–32)
CREATININE: 5.96 mg/dL — AB (ref 0.61–1.24)
Chloride: 97 mmol/L — ABNORMAL LOW (ref 101–111)
GFR calc Af Amer: 10 mL/min — ABNORMAL LOW (ref >60)
GFR, EST NON AFRICAN AMERICAN: 8 mL/min — AB (ref >60)
Glucose, Bld: 219 mg/dL — ABNORMAL HIGH (ref 65–99)
Phosphorus: 3 mg/dL (ref 2.5–4.6)
Potassium: 4.6 mmol/L (ref 3.5–5.1)
SODIUM: 133 mmol/L — AB (ref 135–145)

## 2016-02-17 LAB — CULTURE, BLOOD (ROUTINE X 2)
Culture: NO GROWTH
Culture: NO GROWTH

## 2016-02-17 LAB — PROTIME-INR
INR: 1.65 — AB (ref 0.00–1.49)
PROTHROMBIN TIME: 19.5 s — AB (ref 11.6–15.2)

## 2016-02-17 LAB — CBC
HCT: 25.7 % — ABNORMAL LOW (ref 39.0–52.0)
Hemoglobin: 8.2 g/dL — ABNORMAL LOW (ref 13.0–17.0)
MCH: 27.7 pg (ref 26.0–34.0)
MCHC: 31.9 g/dL (ref 30.0–36.0)
MCV: 86.8 fL (ref 78.0–100.0)
PLATELETS: 171 10*3/uL (ref 150–400)
RBC: 2.96 MIL/uL — AB (ref 4.22–5.81)
RDW: 16 % — ABNORMAL HIGH (ref 11.5–15.5)
WBC: 11.7 10*3/uL — AB (ref 4.0–10.5)

## 2016-02-17 LAB — MAGNESIUM: MAGNESIUM: 2 mg/dL (ref 1.7–2.4)

## 2016-02-18 LAB — PROTIME-INR
INR: 1.5 — AB (ref 0.00–1.49)
PROTHROMBIN TIME: 18.2 s — AB (ref 11.6–15.2)

## 2016-02-18 NOTE — Progress Notes (Signed)
Subjective:  Patient remains quite somnolent at the moment. Continues on dialysis on Tuesday, Thursday, Saturday.   Objective:  Vital signs in last 24 hours:  Temperature 90.9 pulse 80 respirations 23 blood pressure 122/61   Intake/Output:    Physical Exam: General: NAD, laying in bed  HEENT anicteric  Neck supple  Pulm/lungs Normal effort, clear ant/lat  CVS/Heart No rub, irregular  Abdomen:  Soft, NTND BS present  Extremities: No peripheral edema  Neurologic: Somnolent, but arousable. Not answering questions.  Skin: No acute rashes  Access: Rt arm AVF       Basic Metabolic Panel:   Recent Labs Lab 02/12/16 0547 02/13/16 0446 02/14/16 0616 02/17/16 0500 02/17/16 0925  NA 137 136 135 133*  --   K 5.0 4.9 5.0 4.6  --   CL 96* 100* 98* 97*  --   CO2 27 26 25 25   --   GLUCOSE 100* 144* 190* 219*  --   BUN 42* 33* 48* 74*  --   CREATININE 6.58* 5.29* 5.95* 5.96*  --   CALCIUM 8.9 7.9* 8.4* 8.7*  --   MG  --   --   --   --  2.0  PHOS 4.0 3.4 3.9 3.0  --      CBC:  Recent Labs Lab 02/12/16 0548 02/13/16 0446 02/14/16 0616 02/17/16 0925  WBC 10.2 9.6 10.5 11.7*  HGB 9.5* 7.8* 9.1* 8.2*  HCT 31.6* 25.0* 29.1* 25.7*  MCV 90.8 88.3 86.6 86.8  PLT 194 179 183 171      Microbiology:  Recent Results (from the past 720 hour(s))  Culture, blood (routine x 2)     Status: None   Collection Time: 02/12/16 10:45 AM  Result Value Ref Range Status   Specimen Description BLOOD LEFT ARM  Final   Special Requests BOTTLES DRAWN AEROBIC ONLY 10CC  Final   Culture NO GROWTH 5 DAYS  Final   Report Status 02/17/2016 FINAL  Final  Culture, blood (routine x 2)     Status: None   Collection Time: 02/12/16 10:55 AM  Result Value Ref Range Status   Specimen Description BLOOD LEFT HAND  Final   Special Requests IN PEDIATRIC BOTTLE 3CC  Final   Culture NO GROWTH 5 DAYS  Final   Report Status 02/17/2016 FINAL  Final    Coagulation Studies:  Recent Labs   02/16/16 1016 02/17/16 0925 02/18/16 0731  LABPROT 19.4* 19.5* 18.2*  INR 1.64* 1.65* 1.50*    Urinalysis: No results for input(s): COLORURINE, LABSPEC, PHURINE, GLUCOSEU, HGBUR, BILIRUBINUR, KETONESUR, PROTEINUR, UROBILINOGEN, NITRITE, LEUKOCYTESUR in the last 72 hours.  Invalid input(s): APPERANCEUR    Imaging: Dg Abd Portable 1v  02/17/2016  CLINICAL DATA:  Check nasogastric catheter placement EXAM: PORTABLE ABDOMEN - 1 VIEW COMPARISON:  02/13/2016 FINDINGS: Scattered large and small bowel gas is noted. Postsurgical changes as well as post embolic changes are noted. A nasogastric catheter is noted within the stomach. IMPRESSION: Nasogastric catheter within the stomach. Electronically Signed   By: Inez Catalina M.D.   On: 02/17/2016 19:47     Medications:       Assessment/ Plan:  75 y.o.african [place] male with a PMHX of ESRD, DVT with pulmonary emboli, on chronic anticoagulation, history of stroke, moderate-to-severe pulmonary hypertension, end-stage renal disease on dialysis, bilateral severe hydronephrosis, severe vascular disease with carotid artery stenosis and peripheral vascular disease with femoral-popliteal bypass, type 2 diabetes, BPH, HTN, A fib, T2 DM, carcinoma in situ of bladder,  treated with BCG for recurrent carcinoma in situ. He has had indwelling bilateral ureteral stents. In the past, he has had failed placements of stents and ultimately had nephrostomy tubes, which were then converted to internal ureteral stents. He has had atypical cells within his right renal pelvis and atypical cells on a right ureteral biopsy. He has had carcinoma in situ within his bladder   1. ESRD Patient due for dialysis again tomorrow. We will plan for ultrafiltration target of 1-1.5 kg.  2. AOCKD -hemoglobin was down to 8.2 yesterday. Continue to avoid Aranesp given bladder cancer history.  3. SHPTH - phosphorus currently 3.0. No indication to administer binder therapy at the  moment..  4. C Diff colitis - treatment as per primary team   LOS:  [name] [name] 6/28/20173:31 PM

## 2016-02-19 LAB — CBC WITH DIFFERENTIAL/PLATELET
Basophils Absolute: 0 10*3/uL (ref 0.0–0.1)
Basophils Relative: 0 %
Eosinophils Absolute: 0.2 10*3/uL (ref 0.0–0.7)
Eosinophils Relative: 1 %
HEMATOCRIT: 29.3 % — AB (ref 39.0–52.0)
HEMOGLOBIN: 9.1 g/dL — AB (ref 13.0–17.0)
LYMPHS ABS: 1.3 10*3/uL (ref 0.7–4.0)
LYMPHS PCT: 10 %
MCH: 27.4 pg (ref 26.0–34.0)
MCHC: 31.1 g/dL (ref 30.0–36.0)
MCV: 88.3 fL (ref 78.0–100.0)
Monocytes Absolute: 1.1 10*3/uL — ABNORMAL HIGH (ref 0.1–1.0)
Monocytes Relative: 8 %
NEUTROS PCT: 81 %
Neutro Abs: 11.2 10*3/uL — ABNORMAL HIGH (ref 1.7–7.7)
Platelets: 206 10*3/uL (ref 150–400)
RBC: 3.32 MIL/uL — AB (ref 4.22–5.81)
RDW: 16.4 % — ABNORMAL HIGH (ref 11.5–15.5)
WBC: 13.8 10*3/uL — ABNORMAL HIGH (ref 4.0–10.5)

## 2016-02-19 LAB — RENAL FUNCTION PANEL
Albumin: 1.6 g/dL — ABNORMAL LOW (ref 3.5–5.0)
Anion gap: 12 (ref 5–15)
BUN: 83 mg/dL — ABNORMAL HIGH (ref 6–20)
CHLORIDE: 95 mmol/L — AB (ref 101–111)
CO2: 28 mmol/L (ref 22–32)
CREATININE: 4.94 mg/dL — AB (ref 0.61–1.24)
Calcium: 9.2 mg/dL (ref 8.9–10.3)
GFR, EST AFRICAN AMERICAN: 12 mL/min — AB (ref >60)
GFR, EST NON AFRICAN AMERICAN: 10 mL/min — AB (ref >60)
Glucose, Bld: 246 mg/dL — ABNORMAL HIGH (ref 65–99)
POTASSIUM: 4.7 mmol/L (ref 3.5–5.1)
Phosphorus: 2.6 mg/dL (ref 2.5–4.6)
Sodium: 135 mmol/L (ref 135–145)

## 2016-02-19 LAB — MAGNESIUM: MAGNESIUM: 1.9 mg/dL (ref 1.7–2.4)

## 2016-02-19 LAB — PROTIME-INR
INR: 1.5 — AB (ref 0.00–1.49)
PROTHROMBIN TIME: 18.2 s — AB (ref 11.6–15.2)

## 2016-02-20 LAB — PROTIME-INR
INR: 1.98 — ABNORMAL HIGH (ref 0.00–1.49)
PROTHROMBIN TIME: 22.4 s — AB (ref 11.6–15.2)

## 2016-02-20 NOTE — Progress Notes (Signed)
Subjective:  Patient has a fever of 101.4 this a.m. He is arousable but is not following commands.     Objective:  Vital signs in last 24 hours:  Temperature 101.4 pulse 91 respirations 26 blood pressure 114/65   Intake/Output:    Physical Exam: General: NAD, laying in bed  HEENT Anicteric NG tube in place  Neck supple  Pulm/lungs Normal effort, clear ant/lat  CVS/Heart No rub, irregular  Abdomen:  Soft, NTND BS present  Extremities: No peripheral edema  Neurologic: Somnolent, but arousable. Not answering questions.  Skin: No acute rashes  Access: Rt arm AVF       Basic Metabolic Panel:   Recent Labs Lab 02/14/16 0616 02/17/16 0500 02/17/16 0925 02/19/16 0612  NA 135 133*  --  135  K 5.0 4.6  --  4.7  CL 98* 97*  --  95*  CO2 25 25  --  28  GLUCOSE 190* 219*  --  246*  BUN 48* 74*  --  83*  CREATININE 5.95* 5.96*  --  4.94*  CALCIUM 8.4* 8.7*  --  9.2  MG  --   --  2.0 1.9  PHOS 3.9 3.0  --  2.6     CBC:  Recent Labs Lab 02/14/16 0616 02/17/16 0925 02/19/16 0612  WBC 10.5 11.7* 13.8*  NEUTROABS  --   --  11.2*  HGB 9.1* 8.2* 9.1*  HCT 29.1* 25.7* 29.3*  MCV 86.6 86.8 88.3  PLT 183 171 206      Microbiology:  Recent Results (from the past 720 hour(s))  Culture, blood (routine x 2)     Status: None   Collection Time: 02/12/16 10:45 AM  Result Value Ref Range Status   Specimen Description BLOOD LEFT ARM  Final   Special Requests BOTTLES DRAWN AEROBIC ONLY 10CC  Final   Culture NO GROWTH 5 DAYS  Final   Report Status 02/17/2016 FINAL  Final  Culture, blood (routine x 2)     Status: None   Collection Time: 02/12/16 10:55 AM  Result Value Ref Range Status   Specimen Description BLOOD LEFT HAND  Final   Special Requests IN PEDIATRIC BOTTLE 3CC  Final   Culture NO GROWTH 5 DAYS  Final   Report Status 02/17/2016 FINAL  Final    Coagulation Studies:  Recent Labs  02/17/16 0925 02/18/16 0731 02/19/16 0612  LABPROT 19.5* 18.2* 18.2*   INR 1.65* 1.50* 1.50*    Urinalysis: No results for input(s): COLORURINE, LABSPEC, PHURINE, GLUCOSEU, HGBUR, BILIRUBINUR, KETONESUR, PROTEINUR, UROBILINOGEN, NITRITE, LEUKOCYTESUR in the last 72 hours.  Invalid input(s): APPERANCEUR    Imaging: No results found.   Medications:       Assessment/ Plan:  75 y.o.african [place] male with a PMHX of ESRD, DVT with pulmonary emboli, on chronic anticoagulation, history of stroke, moderate-to-severe pulmonary hypertension, end-stage renal disease on dialysis, bilateral severe hydronephrosis, severe vascular disease with carotid artery stenosis and peripheral vascular disease with femoral-popliteal bypass, type 2 diabetes, BPH, HTN, A fib, T2 DM, carcinoma in situ of bladder, treated with BCG for recurrent carcinoma in situ. He has had indwelling bilateral ureteral stents. In the past, he has had failed placements of stents and ultimately had nephrostomy tubes, which were then converted to internal ureteral stents. He has had atypical cells within his right renal pelvis and atypical cells on a right ureteral biopsy. He has had carcinoma in situ within his bladder   1. ESRD We will plan for hemodialysis again  tomorrow.  Tachycardia would last ounces treatment. We will keep conservative ultrafiltration target of 1.5 kg.  2. AOCKD -hemoglobin improved to 9.1.  Continue to avoid Aranesp given history of bladder cancer.  3. SHPTH - phosphorus 2.6 at last check. No indication for binders. Continue to monitor.  4. C Diff colitis - treatment as per primary team  5.  Fever. We will check blood cultures x2 sets. Otherwise management per primary team.  LOS:  [name] [name] 6/30/20179:02 AM

## 2016-02-21 ENCOUNTER — Other Ambulatory Visit (HOSPITAL_COMMUNITY): Payer: Medicare Other

## 2016-02-21 LAB — RENAL FUNCTION PANEL
Albumin: 1.5 g/dL — ABNORMAL LOW (ref 3.5–5.0)
Anion gap: 13 (ref 5–15)
BUN: 103 mg/dL — ABNORMAL HIGH (ref 6–20)
CHLORIDE: 95 mmol/L — AB (ref 101–111)
CO2: 29 mmol/L (ref 22–32)
CREATININE: 5.07 mg/dL — AB (ref 0.61–1.24)
Calcium: 9.5 mg/dL (ref 8.9–10.3)
GFR calc non Af Amer: 10 mL/min — ABNORMAL LOW (ref >60)
GFR, EST AFRICAN AMERICAN: 12 mL/min — AB (ref >60)
Glucose, Bld: 212 mg/dL — ABNORMAL HIGH (ref 65–99)
Phosphorus: 3.6 mg/dL (ref 2.5–4.6)
Potassium: 5.5 mmol/L — ABNORMAL HIGH (ref 3.5–5.1)
Sodium: 137 mmol/L (ref 135–145)

## 2016-02-21 LAB — CBC
HCT: 28.9 % — ABNORMAL LOW (ref 39.0–52.0)
Hemoglobin: 8.9 g/dL — ABNORMAL LOW (ref 13.0–17.0)
MCH: 27.5 pg (ref 26.0–34.0)
MCHC: 30.8 g/dL (ref 30.0–36.0)
MCV: 89.2 fL (ref 78.0–100.0)
PLATELETS: 213 10*3/uL (ref 150–400)
RBC: 3.24 MIL/uL — AB (ref 4.22–5.81)
RDW: 16.3 % — AB (ref 11.5–15.5)
WBC: 16.3 10*3/uL — ABNORMAL HIGH (ref 4.0–10.5)

## 2016-02-21 LAB — PROTIME-INR
INR: 2.03 — ABNORMAL HIGH (ref 0.00–1.49)
Prothrombin Time: 22.9 seconds — ABNORMAL HIGH (ref 11.6–15.2)

## 2016-02-22 LAB — PROTIME-INR
INR: 2.15 — ABNORMAL HIGH (ref 0.00–1.49)
Prothrombin Time: 23.8 seconds — ABNORMAL HIGH (ref 11.6–15.2)

## 2016-02-23 LAB — CBC WITH DIFFERENTIAL/PLATELET
BASOS ABS: 0 10*3/uL (ref 0.0–0.1)
Basophils Relative: 0 %
EOS ABS: 0 10*3/uL (ref 0.0–0.7)
EOS PCT: 0 %
HCT: 25.6 % — ABNORMAL LOW (ref 39.0–52.0)
Hemoglobin: 8.1 g/dL — ABNORMAL LOW (ref 13.0–17.0)
LYMPHS ABS: 0.6 10*3/uL — AB (ref 0.7–4.0)
Lymphocytes Relative: 4 %
MCH: 27.2 pg (ref 26.0–34.0)
MCHC: 31.6 g/dL (ref 30.0–36.0)
MCV: 85.9 fL (ref 78.0–100.0)
MONO ABS: 0.9 10*3/uL (ref 0.1–1.0)
Monocytes Relative: 6 %
Neutro Abs: 15.1 10*3/uL — ABNORMAL HIGH (ref 1.7–7.7)
Neutrophils Relative %: 90 %
PLATELETS: 229 10*3/uL (ref 150–400)
RBC: 2.98 MIL/uL — AB (ref 4.22–5.81)
RDW: 16.6 % — AB (ref 11.5–15.5)
WBC: 16.6 10*3/uL — AB (ref 4.0–10.5)

## 2016-02-23 LAB — RENAL FUNCTION PANEL
Albumin: 1.5 g/dL — ABNORMAL LOW (ref 3.5–5.0)
Anion gap: 22 — ABNORMAL HIGH (ref 5–15)
BUN: 106 mg/dL — AB (ref 6–20)
CALCIUM: 9.9 mg/dL (ref 8.9–10.3)
CHLORIDE: 88 mmol/L — AB (ref 101–111)
CO2: 28 mmol/L (ref 22–32)
CREATININE: 4.98 mg/dL — AB (ref 0.61–1.24)
GFR calc non Af Amer: 10 mL/min — ABNORMAL LOW (ref >60)
GFR, EST AFRICAN AMERICAN: 12 mL/min — AB (ref >60)
GLUCOSE: 307 mg/dL — AB (ref 65–99)
Phosphorus: 3.5 mg/dL (ref 2.5–4.6)
Potassium: 4.7 mmol/L (ref 3.5–5.1)
SODIUM: 138 mmol/L (ref 135–145)

## 2016-02-23 LAB — PROTIME-INR
INR: 2.51 — AB (ref 0.00–1.49)
Prothrombin Time: 26.8 seconds — ABNORMAL HIGH (ref 11.6–15.2)

## 2016-02-23 LAB — MAGNESIUM: Magnesium: 2 mg/dL (ref 1.7–2.4)

## 2016-02-23 NOTE — Progress Notes (Signed)
Subjective:  Blood cultures from 02/20/2016 remain negative. Patient remains tachycardic. We plan to switch him to dialysis on Monday, Wednesday, Friday schedule.     Objective:  Vital signs in last 24 hours:  Temperature 99.8 pulse 97 respirations 24 blood pressure 143/58   Intake/Output:    Physical Exam: General: NAD, laying in bed  HEENT Anicteric NG tube in place  Neck supple  Pulm/lungs Normal effort, clear ant/lat  CVS/Heart No rub, irregular, tachycardic  Abdomen:  Soft, NTND BS present  Extremities: No peripheral edema  Neurologic: Somnolent, but arousable. Not answering questions.  Skin: No acute rashes  Access: Rt arm AVF       Basic Metabolic Panel:   Recent Labs Lab 02/17/16 0500 02/17/16 0925 02/19/16 0612 02/21/16 0839 02/23/16 0416  NA 133*  --  135 137 138  K 4.6  --  4.7 5.5* 4.7  CL 97*  --  95* 95* 88*  CO2 25  --  28 29 28   GLUCOSE 219*  --  246* 212* 307*  BUN 74*  --  83* 103* 106*  CREATININE 5.96*  --  4.94* 5.07* 4.98*  CALCIUM 8.7*  --  9.2 9.5 9.9  MG  --  2.0 1.9  --  2.0  PHOS 3.0  --  2.6 3.6 3.5     CBC:  Recent Labs Lab 02/17/16 0925 02/19/16 0612 02/21/16 0839 02/23/16 0416  WBC 11.7* 13.8* 16.3* 16.6*  NEUTROABS  --  11.2*  --  15.1*  HGB 8.2* 9.1* 8.9* 8.1*  HCT 25.7* 29.3* 28.9* 25.6*  MCV 86.8 88.3 89.2 85.9  PLT 171 206 213 229      Microbiology:  Recent Results (from the past 720 hour(s))  Culture, blood (routine x 2)     Status: None   Collection Time: 02/12/16 10:45 AM  Result Value Ref Range Status   Specimen Description BLOOD LEFT ARM  Final   Special Requests BOTTLES DRAWN AEROBIC ONLY 10CC  Final   Culture NO GROWTH 5 DAYS  Final   Report Status 02/17/2016 FINAL  Final  Culture, blood (routine x 2)     Status: None   Collection Time: 02/12/16 10:55 AM  Result Value Ref Range Status   Specimen Description BLOOD LEFT HAND  Final   Special Requests IN PEDIATRIC BOTTLE 3CC  Final   Culture  NO GROWTH 5 DAYS  Final   Report Status 02/17/2016 FINAL  Final  Culture, blood (routine x 2)     Status: None (Preliminary result)   Collection Time: 02/20/16 10:50 AM  Result Value Ref Range Status   Specimen Description BLOOD LEFT ANTECUBITAL  Final   Special Requests BOTTLES DRAWN AEROBIC ONLY  3.5CC  Final   Culture NO GROWTH 2 DAYS  Final   Report Status PENDING  Incomplete  Culture, blood (routine x 2)     Status: None (Preliminary result)   Collection Time: 02/20/16 10:52 AM  Result Value Ref Range Status   Specimen Description BLOOD LEFT ANTECUBITAL  Final   Special Requests BOTTLES DRAWN AEROBIC ONLY 3.5CC  Final   Culture NO GROWTH 2 DAYS  Final   Report Status PENDING  Incomplete  Culture, respiratory (NON-Expectorated)     Status: None (Preliminary result)   Collection Time: 02/20/16  3:46 PM  Result Value Ref Range Status   Specimen Description TRACHEAL ASPIRATE  Final   Special Requests NONE  Final   Gram Stain   Final    ABUNDANT  WBC PRESENT,BOTH PMN AND MONONUCLEAR FEW YEAST    Culture CULTURE REINCUBATED FOR BETTER GROWTH  Final   Report Status PENDING  Incomplete    Coagulation Studies:  Recent Labs  02/20/16 1008 02/21/16 0839 02/22/16 0648 02/23/16 0416  LABPROT 22.4* 22.9* 23.8* 26.8*  INR 1.98* 2.03* 2.15* 2.51*    Urinalysis: No results for input(s): COLORURINE, LABSPEC, PHURINE, GLUCOSEU, HGBUR, BILIRUBINUR, KETONESUR, PROTEINUR, UROBILINOGEN, NITRITE, LEUKOCYTESUR in the last 72 hours.  Invalid input(s): APPERANCEUR    Imaging: Dg Chest Port 1 View  02/21/2016  CLINICAL DATA:  Pneumonia EXAM: PORTABLE CHEST 1 VIEW COMPARISON:  02/12/2016 chest radiograph. FINDINGS: Enteric tube enters stomach with the tip not seen on this image. Right internal jugular central venous catheter terminates in the right atrium near the cavoatrial junction. Stable cardiomediastinal silhouette with mild cardiomegaly. No pneumothorax. Stable small right pleural  effusion. No definite left pleural effusion. Patchy airspace opacities throughout both lungs are not appreciably changed. IMPRESSION: 1. Support structures as described.  No pneumothorax. 2. Stable mild cardiomegaly and patchy airspace opacities throughout both lungs, favor moderate pulmonary edema, cannot exclude a component of pneumonia at the lung bases. 3. Stable small right pleural effusion. Electronically Signed   By: Ilona Sorrel M.D.   On: 02/21/2016 11:40     Medications:       Assessment/ Plan:  75 y.o.african [place] male with a PMHX of ESRD, DVT with pulmonary emboli, on chronic anticoagulation, history of stroke, moderate-to-severe pulmonary hypertension, end-stage renal disease on dialysis, bilateral severe hydronephrosis, severe vascular disease with carotid artery stenosis and peripheral vascular disease with femoral-popliteal bypass, type 2 diabetes, BPH, HTN, A fib, T2 DM, carcinoma in situ of bladder, treated with BCG for recurrent carcinoma in situ. He has had indwelling bilateral ureteral stents. In the past, he has had failed placements of stents and ultimately had nephrostomy tubes, which were then converted to internal ureteral stents. He has had atypical cells within his right renal pelvis and atypical cells on a right ureteral biopsy. He has had carcinoma in situ within his bladder   1. ESRD - patient to be switched to Monday, Wednesday, Friday dialysis schedule. Discussed case with hospitalist S. Heart rate is a bit high at the moment.  2. AOCKD - hemoglobin down to 8.1. As before we are avoiding Aranesp given history of bladder cancer.  3. SHPTH - phosphorus 3.5 an acceptable. Continue to monitor.  4. C Diff colitis - treatment as per primary team  5.  Fever. Blood cultures from 02/20/2016 are negative thus far. Further workup per hospitalist.  LOS:  Lucero Ide 7/3/20178:57 AM

## 2016-02-24 LAB — PROTIME-INR
INR: 2.76 — ABNORMAL HIGH (ref 0.00–1.49)
PROTHROMBIN TIME: 28.7 s — AB (ref 11.6–15.2)

## 2016-02-24 LAB — CULTURE, RESPIRATORY

## 2016-02-24 LAB — CULTURE, RESPIRATORY W GRAM STAIN

## 2016-02-25 LAB — CBC
HCT: 25.3 % — ABNORMAL LOW (ref 39.0–52.0)
Hemoglobin: 8.1 g/dL — ABNORMAL LOW (ref 13.0–17.0)
MCH: 27.7 pg (ref 26.0–34.0)
MCHC: 32 g/dL (ref 30.0–36.0)
MCV: 86.6 fL (ref 78.0–100.0)
PLATELETS: 253 10*3/uL (ref 150–400)
RBC: 2.92 MIL/uL — AB (ref 4.22–5.81)
RDW: 16.8 % — AB (ref 11.5–15.5)
WBC: 15.4 10*3/uL — AB (ref 4.0–10.5)

## 2016-02-25 LAB — CULTURE, BLOOD (ROUTINE X 2)
Culture: NO GROWTH
Culture: NO GROWTH

## 2016-02-25 LAB — PROTIME-INR
INR: 2.99 — ABNORMAL HIGH (ref 0.00–1.49)
PROTHROMBIN TIME: 30.6 s — AB (ref 11.6–15.2)

## 2016-02-25 LAB — RENAL FUNCTION PANEL
Albumin: 1.5 g/dL — ABNORMAL LOW (ref 3.5–5.0)
Anion gap: 14 (ref 5–15)
BUN: 118 mg/dL — AB (ref 6–20)
CALCIUM: 8.5 mg/dL — AB (ref 8.9–10.3)
CHLORIDE: 94 mmol/L — AB (ref 101–111)
CO2: 25 mmol/L (ref 22–32)
CREATININE: 4.92 mg/dL — AB (ref 0.61–1.24)
GFR calc Af Amer: 12 mL/min — ABNORMAL LOW (ref >60)
GFR, EST NON AFRICAN AMERICAN: 10 mL/min — AB (ref >60)
Glucose, Bld: 402 mg/dL — ABNORMAL HIGH (ref 65–99)
Phosphorus: 4.1 mg/dL (ref 2.5–4.6)
Potassium: 5.1 mmol/L (ref 3.5–5.1)
SODIUM: 133 mmol/L — AB (ref 135–145)

## 2016-02-25 NOTE — Progress Notes (Signed)
Subjective:  Patient completed hemodialysis today. Ultrafiltration achieved was 1 kg. Arousable but not following commands.     Objective:  Vital signs in last 24 hours:  Temperature 90.4 pulse 96 respirations 10 blood pressure 143/66   Intake/Output:    Physical Exam: General: NAD, laying in bed  HEENT Anicteric NG tube in place  Neck supple  Pulm/lungs Normal effort, clear ant/lat  CVS/Heart No rub, irregular  Abdomen:  Soft, NTND BS present  Extremities: No peripheral edema  Neurologic: Somnolent, but arousable. Not answering questions or following commands  Skin: No acute rashes  Access: Rt arm AVF       Basic Metabolic Panel:   Recent Labs Lab 02/19/16 0612 02/21/16 0839 02/23/16 0416 02/25/16 1002  NA 135 137 138 133*  K 4.7 5.5* 4.7 5.1  CL 95* 95* 88* 94*  CO2 28 29 28 25   GLUCOSE 246* 212* 307* 402*  BUN 83* 103* 106* 118*  CREATININE 4.94* 5.07* 4.98* 4.92*  CALCIUM 9.2 9.5 9.9 8.5*  MG 1.9  --  2.0  --   PHOS 2.6 3.6 3.5 4.1     CBC:  Recent Labs Lab 02/19/16 0612 02/21/16 0839 02/23/16 0416 02/25/16 1002  WBC 13.8* 16.3* 16.6* 15.4*  NEUTROABS 11.2*  --  15.1*  --   HGB 9.1* 8.9* 8.1* 8.1*  HCT 29.3* 28.9* 25.6* 25.3*  MCV 88.3 89.2 85.9 86.6  PLT 206 213 229 253      Microbiology:  Recent Results (from the past 720 hour(s))  Culture, blood (routine x 2)     Status: None   Collection Time: 02/12/16 10:45 AM  Result Value Ref Range Status   Specimen Description BLOOD LEFT ARM  Final   Special Requests BOTTLES DRAWN AEROBIC ONLY 10CC  Final   Culture NO GROWTH 5 DAYS  Final   Report Status 02/17/2016 FINAL  Final  Culture, blood (routine x 2)     Status: None   Collection Time: 02/12/16 10:55 AM  Result Value Ref Range Status   Specimen Description BLOOD LEFT HAND  Final   Special Requests IN PEDIATRIC BOTTLE 3CC  Final   Culture NO GROWTH 5 DAYS  Final   Report Status 02/17/2016 FINAL  Final  Culture, blood (routine x  2)     Status: None (Preliminary result)   Collection Time: 02/20/16 10:50 AM  Result Value Ref Range Status   Specimen Description BLOOD LEFT ANTECUBITAL  Final   Special Requests BOTTLES DRAWN AEROBIC ONLY  3.5CC  Final   Culture NO GROWTH 4 DAYS  Final   Report Status PENDING  Incomplete  Culture, blood (routine x 2)     Status: None (Preliminary result)   Collection Time: 02/20/16 10:52 AM  Result Value Ref Range Status   Specimen Description BLOOD LEFT ANTECUBITAL  Final   Special Requests BOTTLES DRAWN AEROBIC ONLY 3.5CC  Final   Culture NO GROWTH 4 DAYS  Final   Report Status PENDING  Incomplete  Culture, respiratory (NON-Expectorated)     Status: None   Collection Time: 02/20/16  3:46 PM  Result Value Ref Range Status   Specimen Description TRACHEAL ASPIRATE  Final   Special Requests NONE  Final   Gram Stain   Final    ABUNDANT WBC PRESENT,BOTH PMN AND MONONUCLEAR FEW YEAST    Culture   Final    MODERATE CANDIDA ALBICANS ABUNDANT CANDIDA GLABRATA    Report Status 02/24/2016 FINAL  Final    Coagulation Studies:  Recent Labs  02/23/16 0416 02/24/16 1137 02/25/16 1002  LABPROT 26.8* 28.7* 30.6*  INR 2.51* 2.76* 2.99*    Urinalysis: No results for input(s): COLORURINE, LABSPEC, PHURINE, GLUCOSEU, HGBUR, BILIRUBINUR, KETONESUR, PROTEINUR, UROBILINOGEN, NITRITE, LEUKOCYTESUR in the last 72 hours.  Invalid input(s): APPERANCEUR    Imaging: No results found.   Medications:       Assessment/ Plan:  75 y.o.african [place] male with a PMHX of ESRD, DVT with pulmonary emboli, on chronic anticoagulation, history of stroke, moderate-to-severe pulmonary hypertension, end-stage renal disease on dialysis, bilateral severe hydronephrosis, severe vascular disease with carotid artery stenosis and peripheral vascular disease with femoral-popliteal bypass, type 2 diabetes, BPH, HTN, A fib, T2 DM, carcinoma in situ of bladder, treated with BCG for recurrent carcinoma in  situ. He has had indwelling bilateral ureteral stents. In the past, he has had failed placements of stents and ultimately had nephrostomy tubes, which were then converted to internal ureteral stents. He has had atypical cells within his right renal pelvis and atypical cells on a right ureteral biopsy. He has had carcinoma in situ within his bladder   1. ESRD - patient completed hemodialysis today. Next also scheduled for Friday. Ultrafiltration has been difficult to perform given recent tachycardia.  2. AOCKD - hemoglobin 8.1. Awaiting an aspirin given history of bladder cancer.  3. SHPTH - phosphorus 4.1 and acceptable.  4. C Diff colitis - treatment as per primary team  5.  Fever. Blood cultures from 02/20/2016 are negative thus far. Further workup per hospitalist.  LOS:  [name] [name] 7/5/20173:52 PM

## 2016-02-26 LAB — PROTIME-INR
INR: 3.17 — AB (ref 0.00–1.49)
Prothrombin Time: 31.9 seconds — ABNORMAL HIGH (ref 11.6–15.2)

## 2016-03-23 DEATH — deceased

## 2018-05-19 IMAGING — CR DG ABD PORTABLE 1V
1 series · 1 of 1 positions shown · non-contrast
Comparison: Chest radiograph 02/01/2016.

CLINICAL DATA: Patient status post NG tube placement.

EXAM:
PORTABLE ABDOMEN - 1 VIEW

[AP]
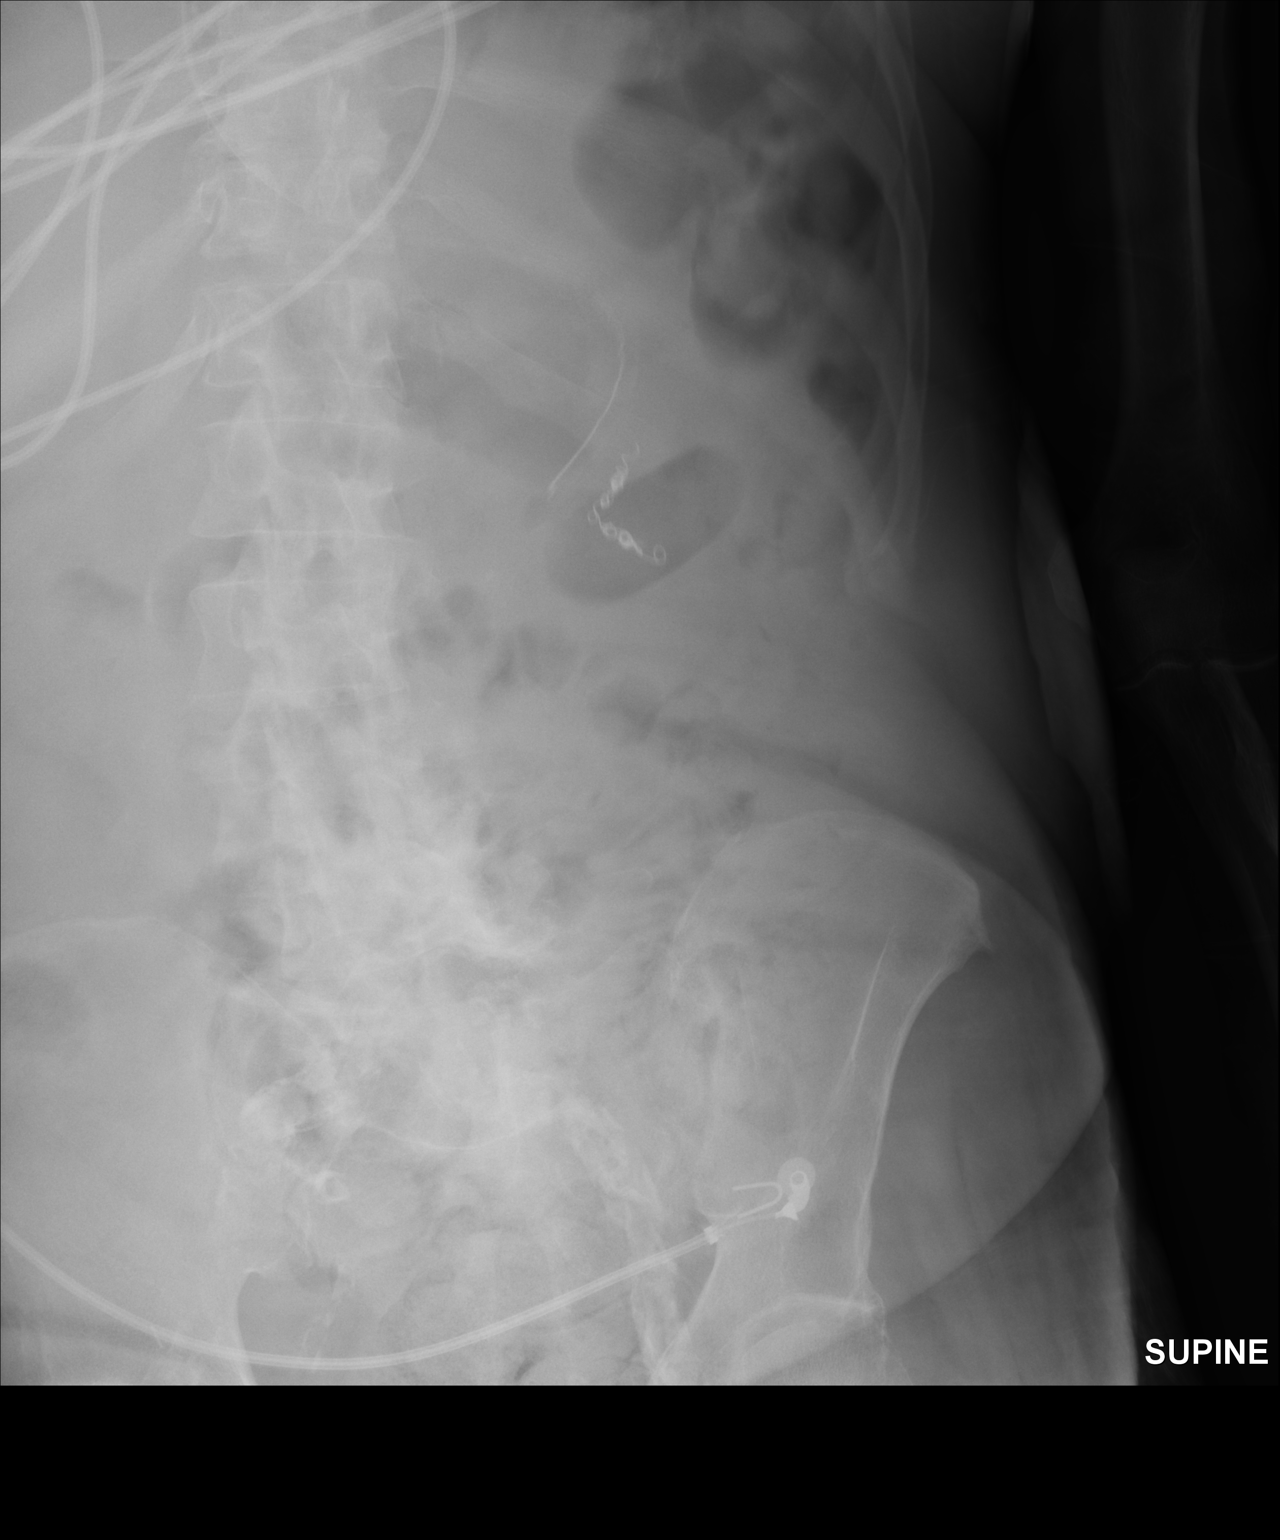

[1 of 1 positions shown; findings below may reference images not displayed]

FINDINGS: Enteric tube tip and side-port project over the stomach. Coil clips
within the left upper quadrant. Unremarkable bowel gas pattern.
Lumbar spine degenerative changes. Large amount of stool within the
rectum.
IMPRESSION: Enteric tube tip and side-port project over the stomach.

Large amount of stool in the rectum as can be seen with
constipation.

Aortic atherosclerotic calcifications.

## 2018-05-24 IMAGING — CR DG ABD PORTABLE 1V
2 series · 2 of 2 positions shown · non-contrast
Comparison: 02/13/2016

CLINICAL DATA: Check nasogastric catheter placement

EXAM:
PORTABLE ABDOMEN - 1 VIEW

[AP (1 of 2)]
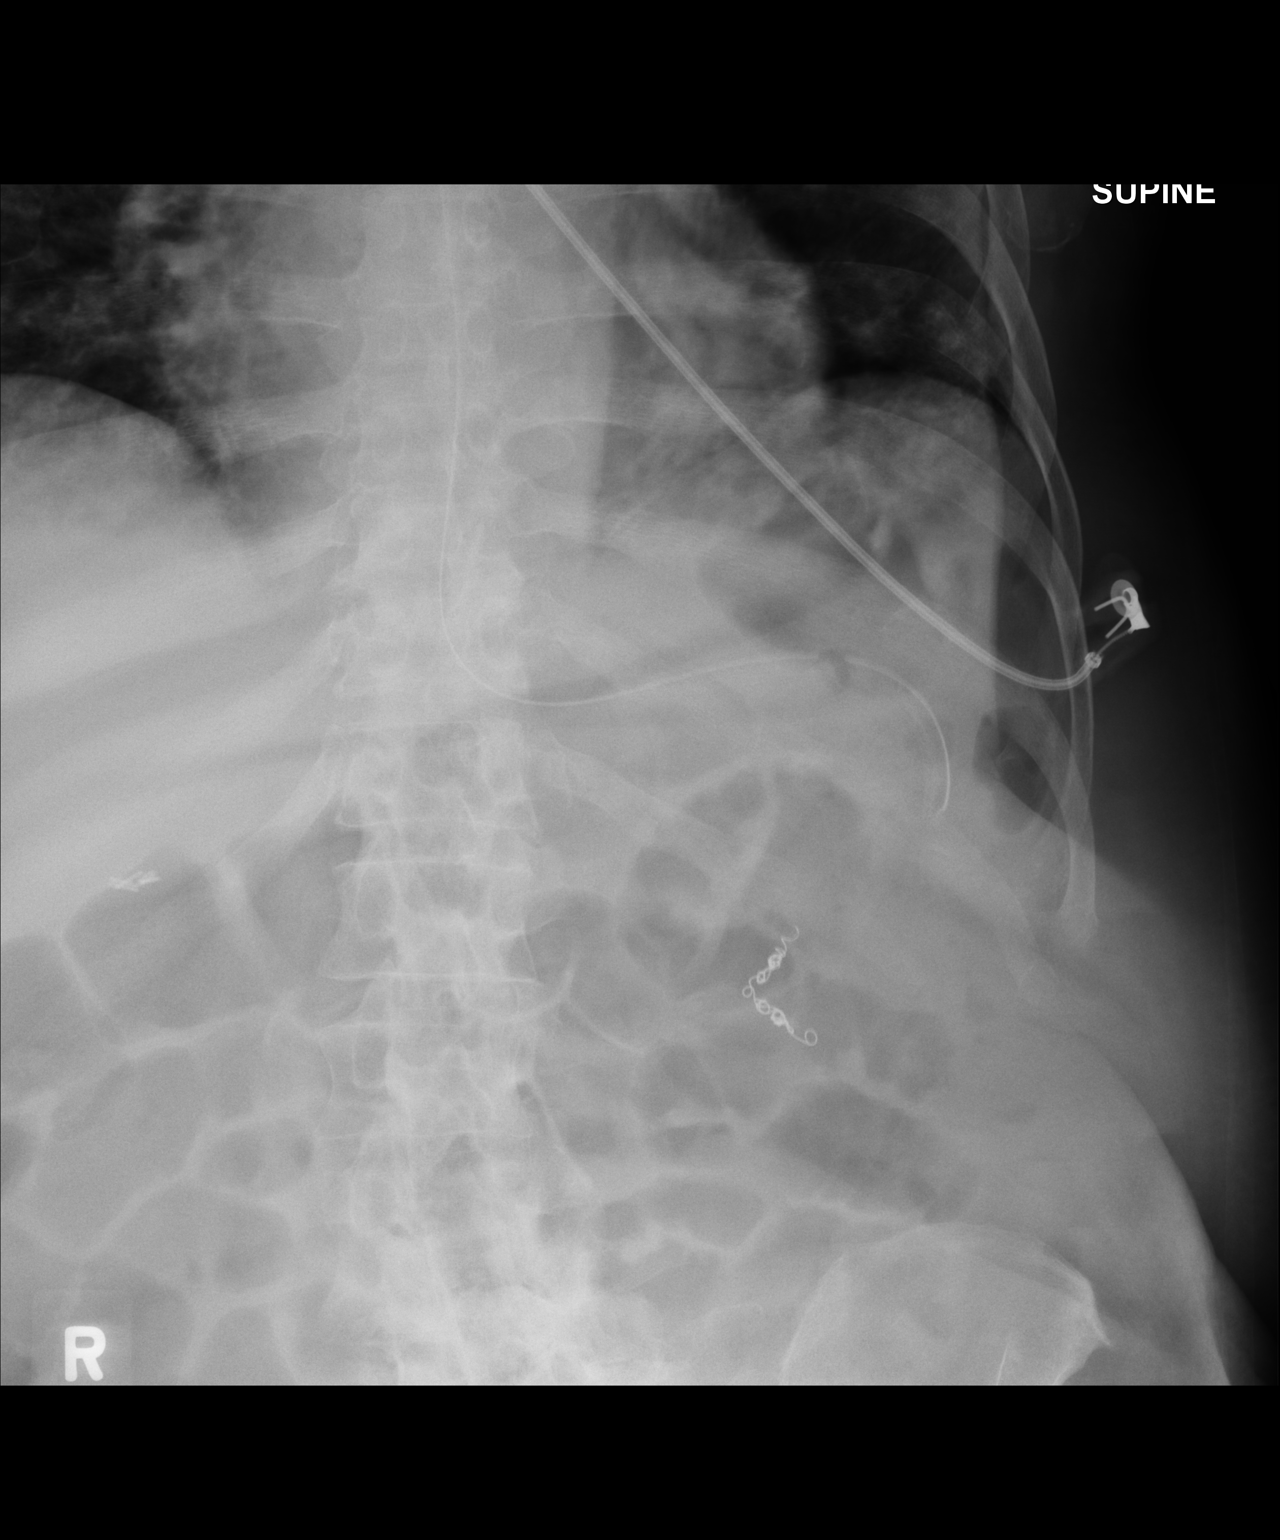

[AP (2 of 2)]
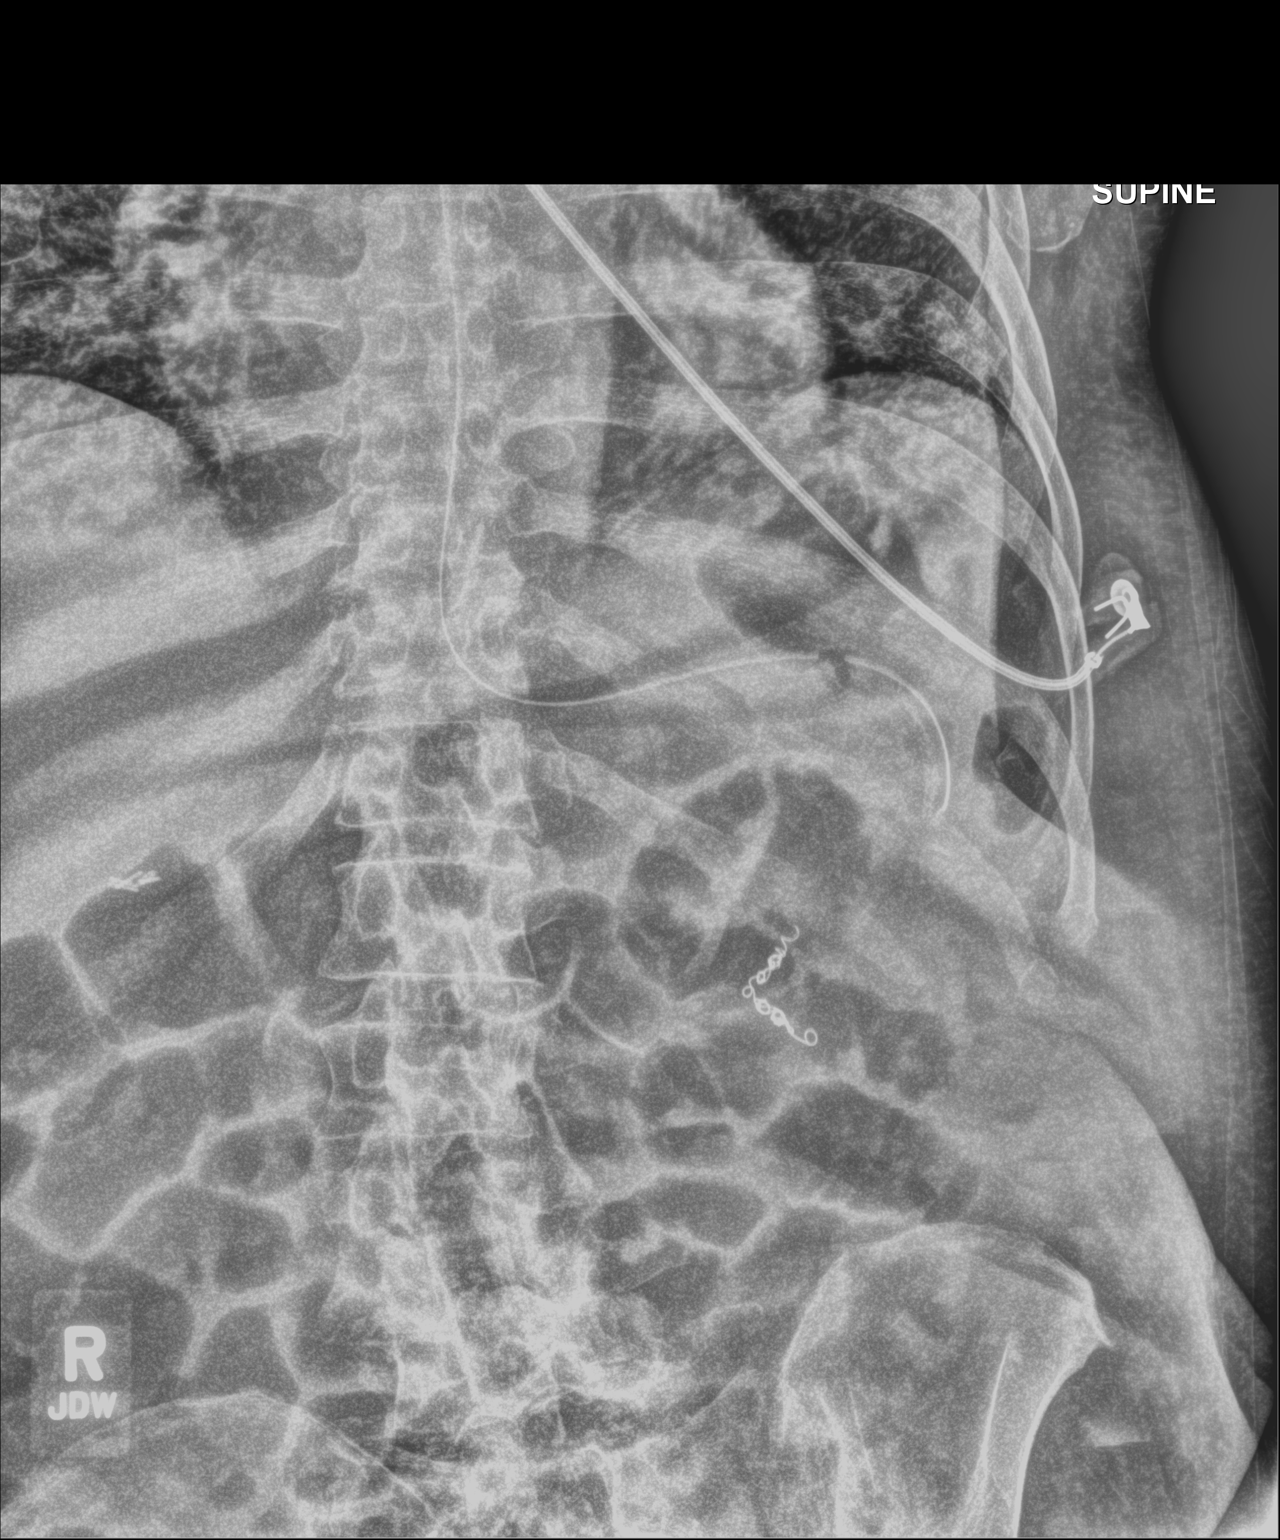

[2 of 2 positions shown; findings below may reference images not displayed]

FINDINGS: Scattered large and small bowel gas is noted. Postsurgical changes
as well as post embolic changes are noted. A nasogastric catheter is
noted within the stomach.
IMPRESSION: Nasogastric catheter within the stomach.
